# Patient Record
Sex: Female | Born: 1995 | Race: White | Hispanic: No | Marital: Single | Smoking: Never smoker
Health system: Southern US, Community
[De-identification: ages and names within clinical notes are randomized; demographics above are authoritative.]

## PROBLEM LIST (undated history)

## (undated) ENCOUNTER — Inpatient Hospital Stay (HOSPITAL_COMMUNITY): Payer: Self-pay

## (undated) DIAGNOSIS — Z9889 Other specified postprocedural states: Secondary | ICD-10-CM

## (undated) DIAGNOSIS — F32A Depression, unspecified: Secondary | ICD-10-CM

## (undated) DIAGNOSIS — F419 Anxiety disorder, unspecified: Secondary | ICD-10-CM

## (undated) DIAGNOSIS — J45909 Unspecified asthma, uncomplicated: Secondary | ICD-10-CM

## (undated) HISTORY — PX: TYMPANOSTOMY TUBE PLACEMENT: SHX32

## (undated) HISTORY — DX: Anxiety disorder, unspecified: F41.9

## (undated) HISTORY — PX: TONSILLECTOMY: SUR1361

## (undated) HISTORY — DX: Depression, unspecified: F32.A

## (undated) HISTORY — DX: Unspecified asthma, uncomplicated: J45.909

## (undated) HISTORY — PX: OTHER SURGICAL HISTORY: SHX169

---
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
## 2016-06-13 IMAGING — US US OB COMP LESS 14 WK
1 series · 14 of 28 positions shown · non-contrast
Comparison: None.

CLINICAL DATA: First trimester pregnancy. Abdominal pain. LMP
unsure. Initial encounter.

EXAM:
OBSTETRIC <14 WK US AND TRANSVAGINAL OB US
TECHNIQUE: Both transabdominal and transvaginal ultrasound examinations were
performed for complete evaluation of the gestation as well as the
maternal uterus, adnexal regions, and pelvic cul-de-sac.
Transvaginal technique was performed to assess early pregnancy.

[Series 1: us ob comp less 14 wk · 33 acquisitions, 14 frames shown]
[im 2/33]
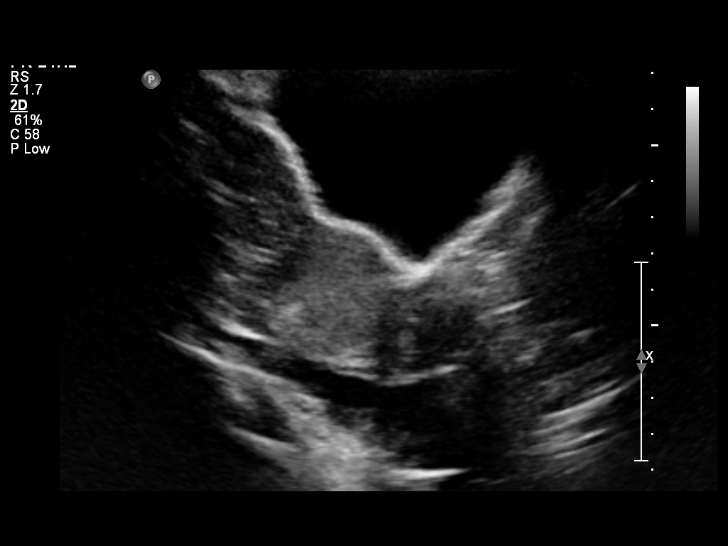
[im 4/33]
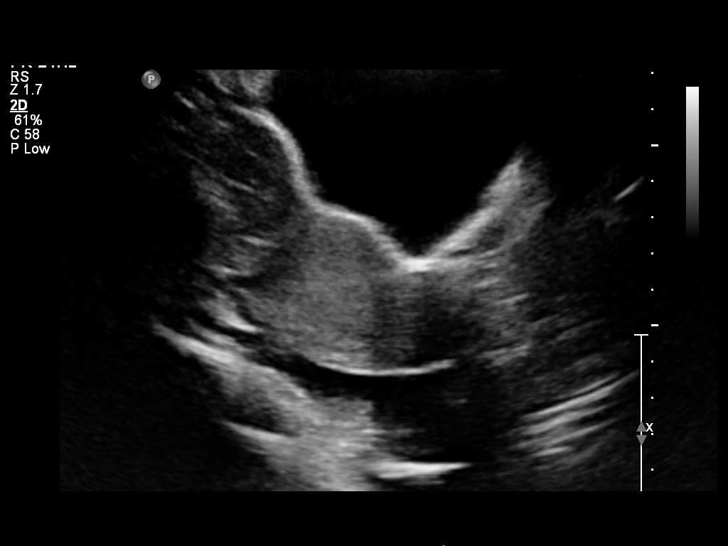
[im 6/33]
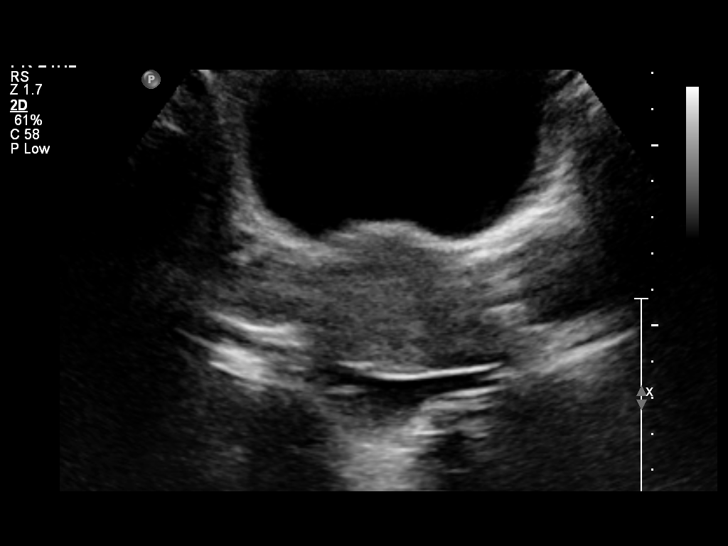
[im 9/33]
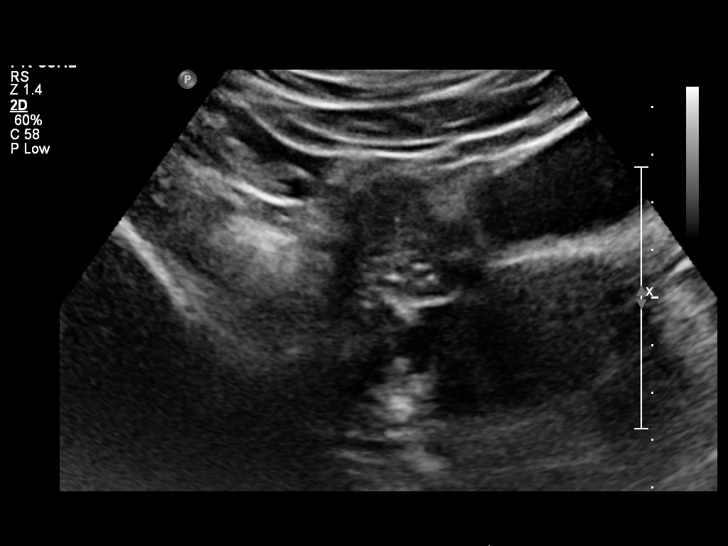
[im 11/33]
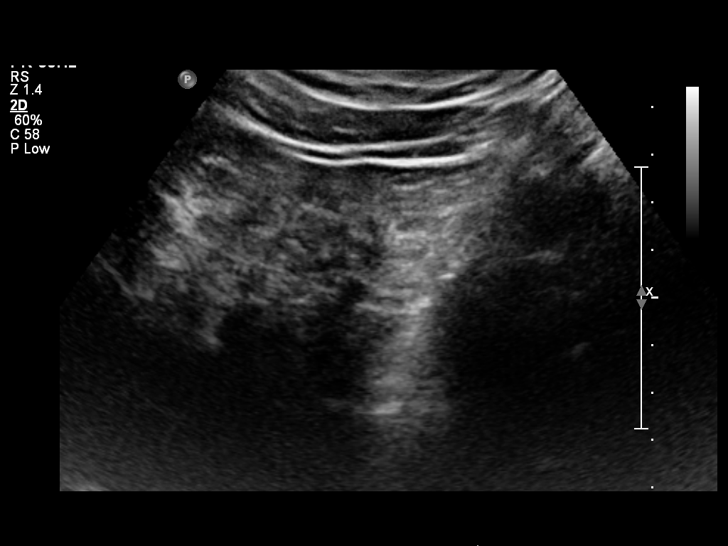
[im 14/33]
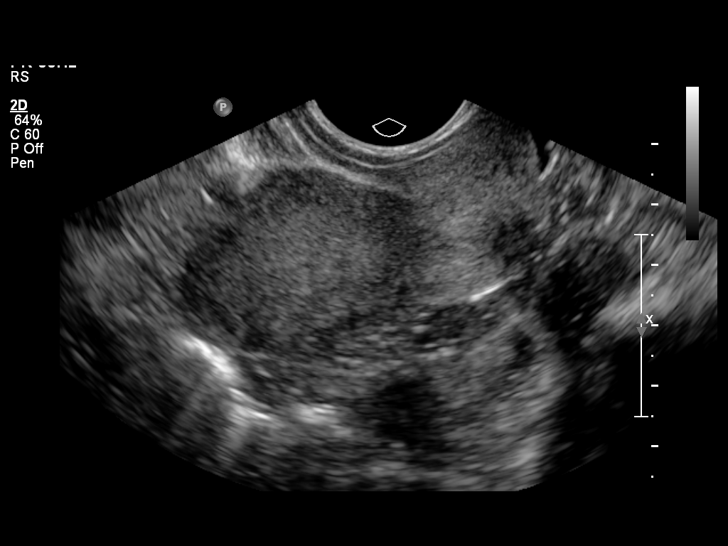
[im 16/33]
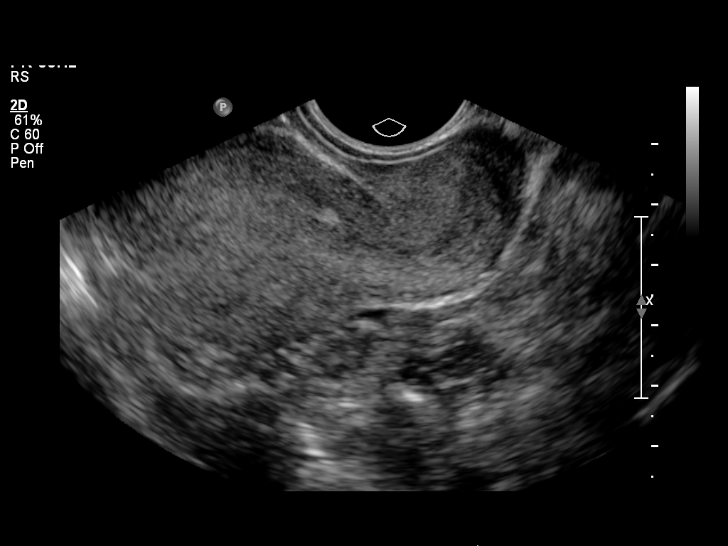
[im 18/33]
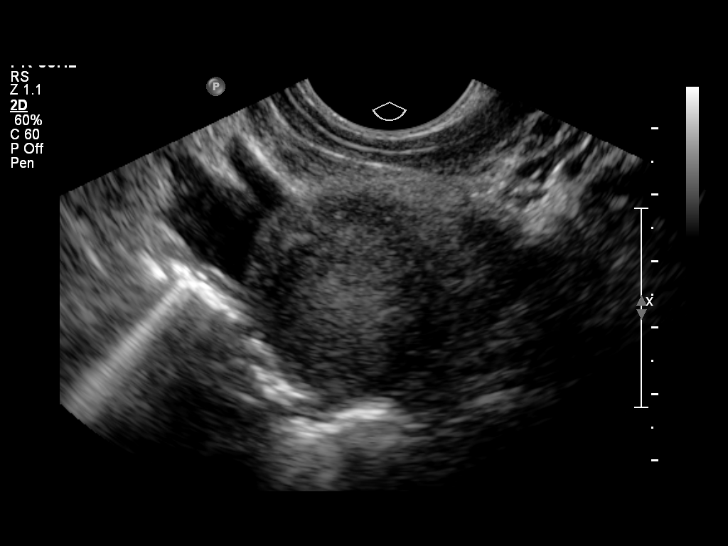
[im 21/33]
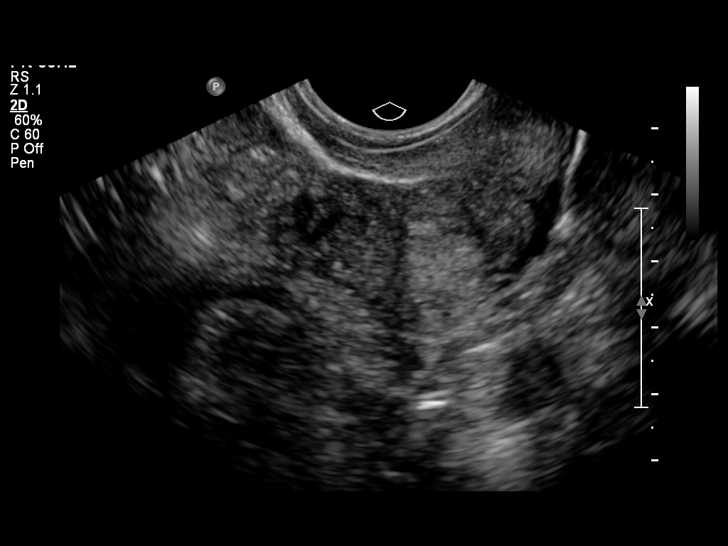
[im 23/33]
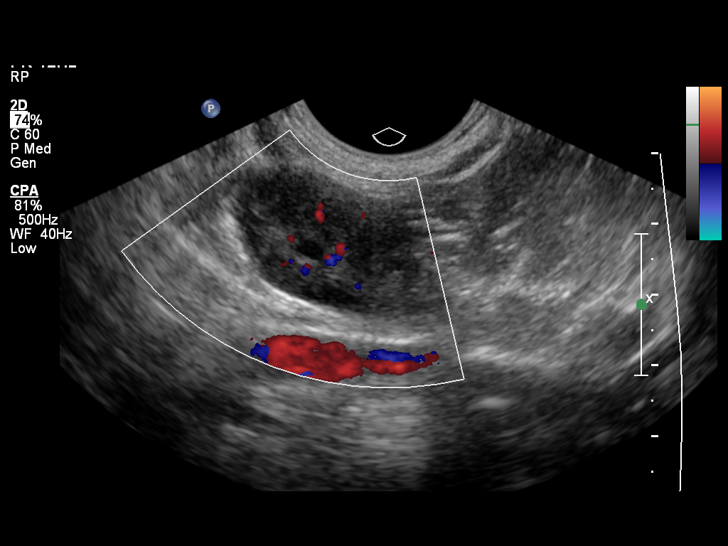
[im 25/33]
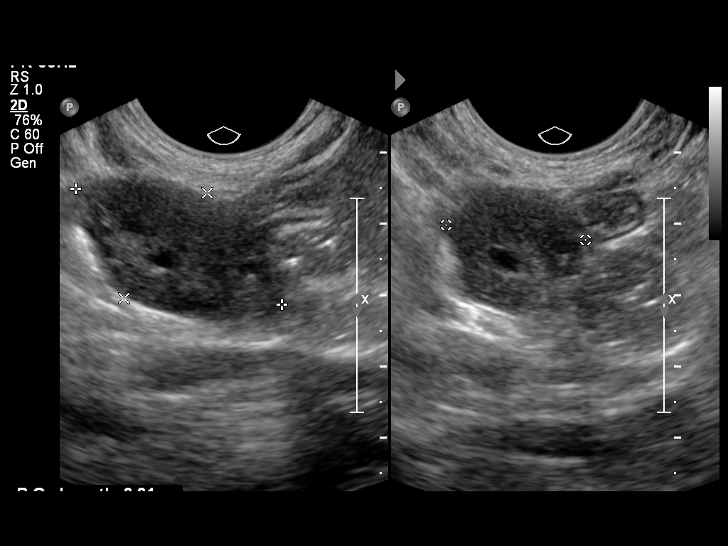
[im 28/33]
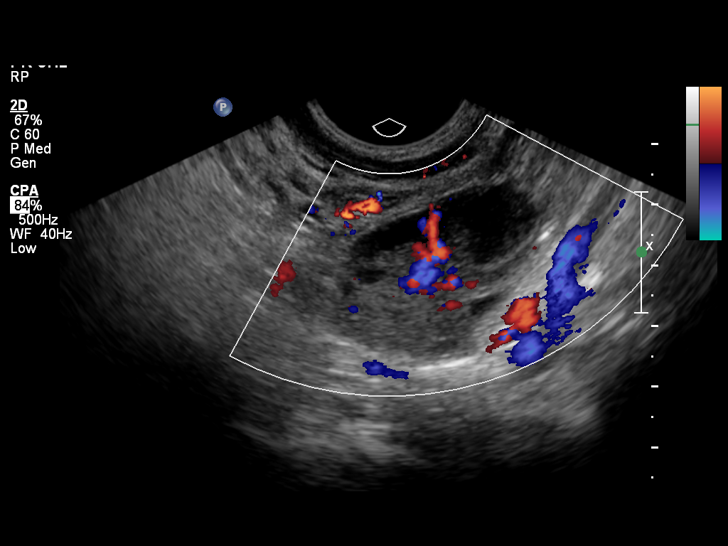
[im 30/33]
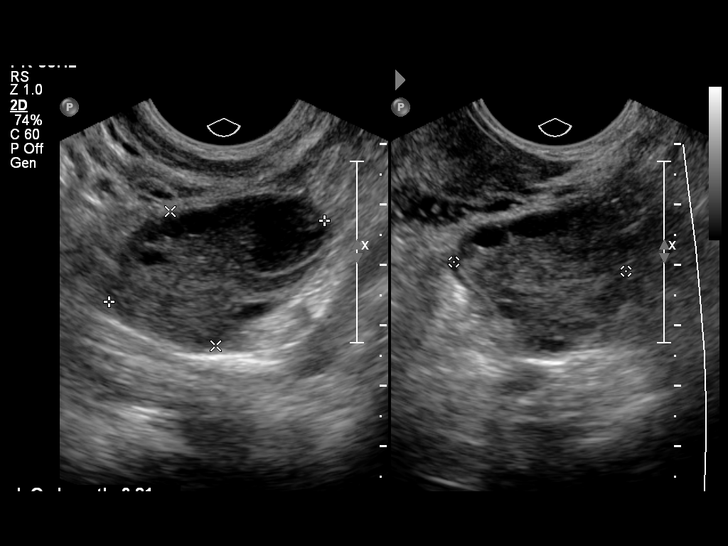
[im 33/33]
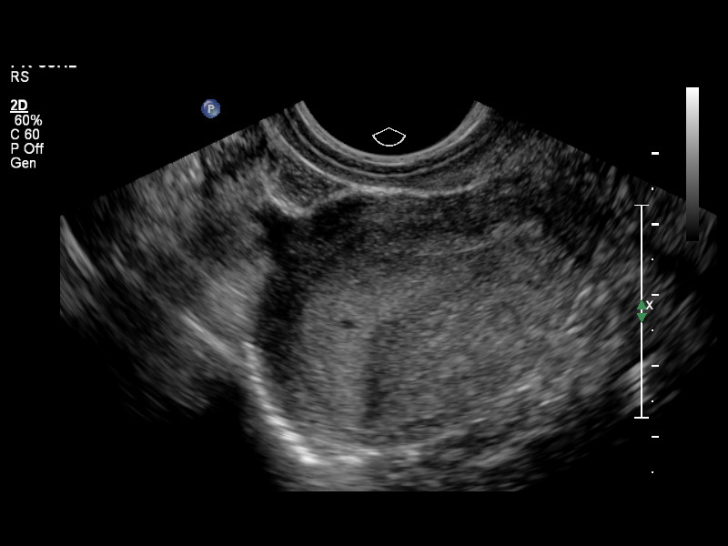

[14 of 28 positions shown; findings below may reference images not displayed]

FINDINGS: Intrauterine gestational sac: No definite intrauterine gestational
sac identified. There are tiny cystic areas within the endometrium.

Yolk sac:  None visualized.

Embryo:  None visualized.

Cardiac Activity: Not visualized.

Maternal uterus/adnexae: Both maternal ovaries are visualized and
appear normal. A small amount of free pelvic fluid is present
asymmetric to the right.
IMPRESSION: No intrauterine gestational sac, yolk sac, fetal pole, or cardiac
activity visualized. Differential considerations include
intrauterine gestation too early to be sonographically visualized,
spontaneous abortion, or ectopic pregnancy. Consider follow-up
ultrasound in 14 days and serial quantitative beta HCG follow-up.

## 2016-07-09 DIAGNOSIS — Z9621 Cochlear implant status: Secondary | ICD-10-CM | POA: Insufficient documentation

## 2016-12-06 DIAGNOSIS — Z8759 Personal history of other complications of pregnancy, childbirth and the puerperium: Secondary | ICD-10-CM | POA: Insufficient documentation

## 2017-08-18 DIAGNOSIS — J45909 Unspecified asthma, uncomplicated: Secondary | ICD-10-CM | POA: Insufficient documentation

## 2018-03-31 DIAGNOSIS — G8921 Chronic pain due to trauma: Secondary | ICD-10-CM | POA: Insufficient documentation

## 2018-09-30 DIAGNOSIS — H906 Mixed conductive and sensorineural hearing loss, bilateral: Secondary | ICD-10-CM | POA: Insufficient documentation

## 2018-12-02 DIAGNOSIS — Z23 Encounter for immunization: Secondary | ICD-10-CM | POA: Diagnosis not present

## 2018-12-02 DIAGNOSIS — G8921 Chronic pain due to trauma: Secondary | ICD-10-CM | POA: Diagnosis not present

## 2019-05-28 DIAGNOSIS — Z23 Encounter for immunization: Secondary | ICD-10-CM | POA: Diagnosis not present

## 2019-06-25 DIAGNOSIS — Z23 Encounter for immunization: Secondary | ICD-10-CM | POA: Diagnosis not present

## 2019-07-29 DIAGNOSIS — Z419 Encounter for procedure for purposes other than remedying health state, unspecified: Secondary | ICD-10-CM | POA: Diagnosis not present

## 2019-08-29 DIAGNOSIS — Z419 Encounter for procedure for purposes other than remedying health state, unspecified: Secondary | ICD-10-CM | POA: Diagnosis not present

## 2019-09-29 DIAGNOSIS — R198 Other specified symptoms and signs involving the digestive system and abdomen: Secondary | ICD-10-CM | POA: Diagnosis not present

## 2019-09-29 DIAGNOSIS — H938X2 Other specified disorders of left ear: Secondary | ICD-10-CM | POA: Diagnosis not present

## 2019-09-29 DIAGNOSIS — M2651 Abnormal jaw closure: Secondary | ICD-10-CM | POA: Diagnosis not present

## 2019-09-29 DIAGNOSIS — H6121 Impacted cerumen, right ear: Secondary | ICD-10-CM | POA: Diagnosis not present

## 2019-09-29 DIAGNOSIS — Z419 Encounter for procedure for purposes other than remedying health state, unspecified: Secondary | ICD-10-CM | POA: Diagnosis not present

## 2019-10-29 DIAGNOSIS — Z419 Encounter for procedure for purposes other than remedying health state, unspecified: Secondary | ICD-10-CM | POA: Diagnosis not present

## 2019-11-29 DIAGNOSIS — Z419 Encounter for procedure for purposes other than remedying health state, unspecified: Secondary | ICD-10-CM | POA: Diagnosis not present

## 2019-12-14 DIAGNOSIS — J4541 Moderate persistent asthma with (acute) exacerbation: Secondary | ICD-10-CM | POA: Diagnosis not present

## 2019-12-14 DIAGNOSIS — J302 Other seasonal allergic rhinitis: Secondary | ICD-10-CM | POA: Diagnosis not present

## 2019-12-14 DIAGNOSIS — Z131 Encounter for screening for diabetes mellitus: Secondary | ICD-10-CM | POA: Diagnosis not present

## 2019-12-14 DIAGNOSIS — G629 Polyneuropathy, unspecified: Secondary | ICD-10-CM | POA: Diagnosis not present

## 2019-12-14 DIAGNOSIS — Q231 Congenital insufficiency of aortic valve: Secondary | ICD-10-CM | POA: Diagnosis not present

## 2019-12-29 DIAGNOSIS — Z419 Encounter for procedure for purposes other than remedying health state, unspecified: Secondary | ICD-10-CM | POA: Diagnosis not present

## 2020-02-29 DIAGNOSIS — Z419 Encounter for procedure for purposes other than remedying health state, unspecified: Secondary | ICD-10-CM | POA: Diagnosis not present

## 2020-04-28 DIAGNOSIS — Z419 Encounter for procedure for purposes other than remedying health state, unspecified: Secondary | ICD-10-CM | POA: Diagnosis not present

## 2020-05-28 DIAGNOSIS — Z419 Encounter for procedure for purposes other than remedying health state, unspecified: Secondary | ICD-10-CM | POA: Diagnosis not present

## 2020-06-28 DIAGNOSIS — Z419 Encounter for procedure for purposes other than remedying health state, unspecified: Secondary | ICD-10-CM | POA: Diagnosis not present

## 2020-07-28 DIAGNOSIS — Z419 Encounter for procedure for purposes other than remedying health state, unspecified: Secondary | ICD-10-CM | POA: Diagnosis not present

## 2020-08-31 DIAGNOSIS — H5213 Myopia, bilateral: Secondary | ICD-10-CM | POA: Diagnosis not present

## 2020-09-18 DIAGNOSIS — Z9889 Other specified postprocedural states: Secondary | ICD-10-CM | POA: Diagnosis not present

## 2020-09-18 DIAGNOSIS — Z09 Encounter for follow-up examination after completed treatment for conditions other than malignant neoplasm: Secondary | ICD-10-CM | POA: Diagnosis not present

## 2020-09-18 DIAGNOSIS — Z8774 Personal history of (corrected) congenital malformations of heart and circulatory system: Secondary | ICD-10-CM | POA: Diagnosis not present

## 2020-09-18 DIAGNOSIS — Q230 Congenital stenosis of aortic valve: Secondary | ICD-10-CM | POA: Diagnosis not present

## 2020-09-18 DIAGNOSIS — R079 Chest pain, unspecified: Secondary | ICD-10-CM | POA: Diagnosis not present

## 2020-09-23 DIAGNOSIS — R079 Chest pain, unspecified: Secondary | ICD-10-CM | POA: Diagnosis not present

## 2021-01-11 ENCOUNTER — Ambulatory Visit: Payer: Self-pay | Admitting: Family Medicine

## 2021-02-20 DIAGNOSIS — H60502 Unspecified acute noninfective otitis externa, left ear: Secondary | ICD-10-CM | POA: Diagnosis not present

## 2021-02-20 DIAGNOSIS — Z888 Allergy status to other drugs, medicaments and biological substances status: Secondary | ICD-10-CM | POA: Diagnosis not present

## 2021-02-20 DIAGNOSIS — Z882 Allergy status to sulfonamides status: Secondary | ICD-10-CM | POA: Diagnosis not present

## 2021-02-20 DIAGNOSIS — Z79899 Other long term (current) drug therapy: Secondary | ICD-10-CM | POA: Diagnosis not present

## 2021-04-19 ENCOUNTER — Encounter (HOSPITAL_COMMUNITY): Payer: Self-pay | Admitting: Emergency Medicine

## 2021-04-19 ENCOUNTER — Emergency Department (HOSPITAL_COMMUNITY)
Admission: EM | Admit: 2021-04-19 | Discharge: 2021-04-19 | Disposition: A | Discharge status: Home / Self Care | Payer: Medicaid Other | Attending: Emergency Medicine | Admitting: Emergency Medicine

## 2021-04-19 ENCOUNTER — Emergency Department (HOSPITAL_COMMUNITY): Payer: Medicaid Other

## 2021-04-19 DIAGNOSIS — Z7982 Long term (current) use of aspirin: Secondary | ICD-10-CM | POA: Insufficient documentation

## 2021-04-19 DIAGNOSIS — Z20822 Contact with and (suspected) exposure to covid-19: Secondary | ICD-10-CM | POA: Insufficient documentation

## 2021-04-19 DIAGNOSIS — J189 Pneumonia, unspecified organism: Secondary | ICD-10-CM | POA: Insufficient documentation

## 2021-04-19 DIAGNOSIS — J45909 Unspecified asthma, uncomplicated: Secondary | ICD-10-CM | POA: Insufficient documentation

## 2021-04-19 DIAGNOSIS — R519 Headache, unspecified: Secondary | ICD-10-CM | POA: Diagnosis present

## 2021-04-19 LAB — CBC WITH DIFFERENTIAL/PLATELET
Abs Immature Granulocytes: 0.02 10*3/uL (ref 0.00–0.07)
Basophils Absolute: 0 10*3/uL (ref 0.0–0.1)
Basophils Relative: 0 %
Eosinophils Absolute: 0 10*3/uL (ref 0.0–0.5)
Eosinophils Relative: 0 %
HCT: 39.9 % (ref 36.0–46.0)
Hemoglobin: 13 g/dL (ref 12.0–15.0)
Immature Granulocytes: 0 %
Lymphocytes Relative: 13 %
Lymphs Abs: 0.9 10*3/uL (ref 0.7–4.0)
MCH: 27.4 pg (ref 26.0–34.0)
MCHC: 32.6 g/dL (ref 30.0–36.0)
MCV: 84 fL (ref 80.0–100.0)
Monocytes Absolute: 0.5 10*3/uL (ref 0.1–1.0)
Monocytes Relative: 6 %
Neutro Abs: 5.6 10*3/uL (ref 1.7–7.7)
Neutrophils Relative %: 81 %
Platelets: 187 10*3/uL (ref 150–400)
RBC: 4.75 MIL/uL (ref 3.87–5.11)
RDW: 13.8 % (ref 11.5–15.5)
WBC: 7 10*3/uL (ref 4.0–10.5)
nRBC: 0 % (ref 0.0–0.2)

## 2021-04-19 LAB — URINALYSIS, ROUTINE W REFLEX MICROSCOPIC
Bilirubin Urine: NEGATIVE
Glucose, UA: NEGATIVE mg/dL
Ketones, ur: 80 mg/dL — AB
Leukocytes,Ua: NEGATIVE
Nitrite: NEGATIVE
Protein, ur: 30 mg/dL — AB
Specific Gravity, Urine: 1.027 (ref 1.005–1.030)
pH: 5 (ref 5.0–8.0)

## 2021-04-19 LAB — PREGNANCY, URINE: Preg Test, Ur: NEGATIVE

## 2021-04-19 LAB — COMPREHENSIVE METABOLIC PANEL
ALT: 16 U/L (ref 0–44)
AST: 19 U/L (ref 15–41)
Albumin: 4.5 g/dL (ref 3.5–5.0)
Alkaline Phosphatase: 51 U/L (ref 38–126)
Anion gap: 12 (ref 5–15)
BUN: 12 mg/dL (ref 6–20)
CO2: 22 mmol/L (ref 22–32)
Calcium: 9.1 mg/dL (ref 8.9–10.3)
Chloride: 100 mmol/L (ref 98–111)
Creatinine, Ser: 0.56 mg/dL (ref 0.44–1.00)
GFR, Estimated: >60 mL/min (ref >60)
Glucose, Bld: 98 mg/dL (ref 70–99)
Potassium: 3.8 mmol/L (ref 3.5–5.1)
Sodium: 134 mmol/L — ABNORMAL LOW (ref 135–145)
Total Bilirubin: 0.3 mg/dL (ref 0.3–1.2)
Total Protein: 8.1 g/dL (ref 6.5–8.1)

## 2021-04-19 LAB — RESP PANEL BY RT-PCR (FLU A&B, COVID) ARPGX2
Influenza A by PCR: NEGATIVE
Influenza B by PCR: NEGATIVE
SARS Coronavirus 2 by RT PCR: NEGATIVE

## 2021-04-19 MED ORDER — IBUPROFEN 400 MG PO TABS
400.0000 mg | ORAL_TABLET | Freq: Once | ORAL | Status: AC
  Administered 2021-04-19: 400 mg via ORAL
  Filled 2021-04-19: qty 1

## 2021-04-19 MED ORDER — SODIUM CHLORIDE 0.9 % IV BOLUS
1000.0000 mL | Freq: Once | INTRAVENOUS | Status: AC
  Administered 2021-04-19: 1000 mL via INTRAVENOUS

## 2021-04-19 MED ORDER — ACETAMINOPHEN 325 MG PO TABS
650.0000 mg | ORAL_TABLET | Freq: Once | ORAL | Status: AC
  Administered 2021-04-19: 650 mg via ORAL
  Filled 2021-04-19: qty 2

## 2021-04-19 MED ORDER — PROMETHAZINE HCL 12.5 MG PO TABS
25.0000 mg | ORAL_TABLET | Freq: Once | ORAL | Status: AC
  Administered 2021-04-19: 25 mg via ORAL
  Filled 2021-04-19: qty 2

## 2021-04-19 MED ORDER — AZITHROMYCIN 250 MG PO TABS: 250.0000 mg | ORAL_TABLET | Freq: Every day | ORAL | 0 refills | Status: DC

## 2021-04-19 MED ORDER — CEFDINIR 300 MG PO CAPS: 300.0000 mg | ORAL_CAPSULE | Freq: Two times a day (BID) | ORAL | 0 refills | Status: AC

## 2021-04-19 MED ORDER — AZITHROMYCIN 250 MG PO TABS
500.0000 mg | ORAL_TABLET | Freq: Once | ORAL | Status: AC
  Administered 2021-04-19: 500 mg via ORAL
  Filled 2021-04-19: qty 2

## 2021-04-19 MED ORDER — CEFDINIR 300 MG PO CAPS
300.0000 mg | ORAL_CAPSULE | Freq: Once | ORAL | Status: AC
  Administered 2021-04-19: 300 mg via ORAL
  Filled 2021-04-19: qty 1

## 2021-04-19 NOTE — Discharge Instructions (Signed)
Your chest x-ray shows pneumonia.  This appears to be causing your fever and headache.  You are being prescribed antibiotics for this, start these antibiotics tomorrow as you were given the first dose of both tonight.  Be sure to take ibuprofen and/or Tylenol for pain and/or fever. ? ?If you develop shortness of breath, chest pain, vomiting, or any other new/concerning symptoms then return to the ER for evaluation. ?

## 2021-04-19 NOTE — ED Notes (Signed)
Pt taken to xray 

## 2021-04-19 NOTE — ED Triage Notes (Signed)
Pt c/o headache on and off for the past 2 days. Pt states she tried tylenol with no relief.  ?

## 2021-04-19 NOTE — ED Provider Notes (Signed)
?[facility] ?Provider Note ? ? ?CSN: [id number] ?Arrival date & time: 04/19/21  1927 ? ?  ? ?History ? ?Chief Complaint  ?Patient presents with  ? Headache  ? ? ?Jo Martin is a 26 y.o. female. ? ?HPI ?26 year old female with a history of asthma presents with headache.  Symptoms started yesterday.  She has had a cough as well as a waxing and waning headache that is mostly frontal but sometimes global.  At times it will be severe and other times will be more moderate.  She has felt warm but did not know she had a fever until she got here and her temperature was 103.  She denies any sore throat, nasal congestion, or shortness of breath.  No neck stiffness.  Feels like she is dehydrated and has had nausea without vomiting and is not eating as well or drinking as well today. ? ?Home Medications ?Prior to Admission medications   ?Medication Sig Start Date End Date Taking? Authorizing Provider  ?azithromycin (ZITHROMAX) 250 MG tablet Take 1 tablet (250 mg total) by mouth daily. 04/19/21  Yes Pricilla Loveless, MD  ?cefdinir (OMNICEF) 300 MG capsule Take 1 capsule (300 mg total) by mouth 2 (two) times daily for 7 days. 04/19/21 04/26/21 Yes Pricilla Loveless, MD  ?acetaminophen (TYLENOL) 500 MG tablet Take 1,000 mg by mouth every 6 (six) hours as needed for moderate pain.    [provider]  ?albuterol (PROVENTIL HFA;VENTOLIN HFA) 108 (90 BASE) MCG/ACT inhaler Inhale 2 puffs into the lungs every 6 (six) hours as needed for wheezing or shortness of breath.    [provider]  ?aspirin 325 MG tablet Take 325 mg by mouth 2 (two) times daily.    [provider]  ?LORazepam (ATIVAN) 0.5 MG tablet Take 0.5 mg by mouth 2 (two) times daily.    [provider]  ?oxyCODONE (ROXICODONE) 5 MG immediate release tablet Take 1 tablet (5 mg total) by mouth every 4 (four) hours as needed for severe pain. 11/21/14   Gavin Pound, MD  ?oxyCODONE-acetaminophen (PERCOCET/ROXICET) 5-325 MG  tablet Take 1 tablet by mouth every 6 (six) hours as needed for severe pain.    [provider]  ?   ? ?Allergies    ?Augmentin [amoxicillin-pot clavulanate]   ? ?Review of Systems   ?Review of Systems  ?HENT:  Negative for ear pain.   ?Respiratory:  Positive for cough. Negative for shortness of breath.   ?Cardiovascular:  Negative for chest pain.  ?Gastrointestinal:  Positive for nausea. Negative for abdominal pain and vomiting.  ?Genitourinary:  Negative for dysuria.  ?Musculoskeletal:  Negative for neck pain.  ?Neurological:  Positive for headaches.  ? ?Physical Exam ?Updated Vital Signs ?BP 114/77   Pulse (!) 105   Temp (!) 103.1 ?F (39.5 ?C) (Oral)   Resp 17   Ht 4\' 8"  (1.422 m)   Wt 76.2 kg   SpO2 98%   BMI 37.66 kg/m?  ?Physical Exam ?Vitals and nursing note reviewed.  ?Constitutional:   ?   Appearance: She is well-developed. She is obese. She is not ill-appearing or diaphoretic.  ?HENT:  ?   Head: Normocephalic and atraumatic.  ?Eyes:  ?   Extraocular Movements: Extraocular movements intact.  ?   Pupils: Pupils are equal, round, and reactive to light.  ?Cardiovascular:  ?   Rate and Rhythm: Regular rhythm. Tachycardia present.  ?   Heart sounds: Normal heart sounds.  ?Pulmonary:  ?   Effort: Pulmonary  effort is normal.  ?   Breath sounds: Normal breath sounds. No rales.  ?Abdominal:  ?   Palpations: Abdomen is soft.  ?   Tenderness: There is no abdominal tenderness.  ?Musculoskeletal:  ?   Cervical back: Normal range of motion and neck supple. No rigidity.  ?Skin: ?   General: Skin is warm and dry.  ?Neurological:  ?   Mental Status: She is alert and oriented to person, place, and time.  ?   Comments: CN 3-12 grossly intact. 5/5 strength in all 4 extremities. Grossly normal sensation. Normal finger to nose.   ? ? ?ED Results / Procedures / Treatments   ?Labs ?(all labs ordered are listed, but only abnormal results are displayed) ?Labs Reviewed  ?URINALYSIS, ROUTINE W REFLEX MICROSCOPIC -  Abnormal; Notable for the following components:  ?    Result Value  ? Hgb urine dipstick MODERATE (*)   ? Ketones, ur 80 (*)   ? Protein, ur 30 (*)   ? Bacteria, UA RARE (*)   ? All other components within normal limits  ?COMPREHENSIVE METABOLIC PANEL - Abnormal; Notable for the following components:  ? Sodium 134 (*)   ? All other components within normal limits  ?RESP PANEL BY RT-PCR (FLU A&B, COVID) ARPGX2  ?CBC WITH DIFFERENTIAL/PLATELET  ?PREGNANCY, URINE  ? ? ?EKG ?None ? ?Radiology ?DG Chest 2 View ? ?Result Date: 04/19/2021 ?CLINICAL DATA:  Cough and fever EXAM: CHEST - 2 VIEW COMPARISON:  07/04/2017 FINDINGS: Airspace disease in the right middle lobe consistent with pneumonia. No pleural effusion. Normal cardiac size. Post sternotomy changes. Scoliosis of the spine. IMPRESSION: Right middle lobe airspace disease concerning for pneumonia Electronically Signed   By: Jasmine PangKim  Fujinaga M.D.   On: 04/19/2021 22:10   ? ?Procedures ?Procedures  ? ? ?Medications Ordered in ED ?Medications  ?acetaminophen (TYLENOL) tablet 650 mg (650 mg Oral Given 04/19/21 2117)  ?ibuprofen (ADVIL) tablet 400 mg (400 mg Oral Given 04/19/21 2119)  ?sodium chloride 0.9 % bolus 1,000 mL (0 mLs Intravenous Stopped 04/19/21 2250)  ?promethazine (PHENERGAN) tablet 25 mg (25 mg Oral Given 04/19/21 2157)  ?azithromycin (ZITHROMAX) tablet 500 mg (500 mg Oral Given 04/19/21 2238)  ?cefdinir (OMNICEF) capsule 300 mg (300 mg Oral Given 04/19/21 2239)  ? ? ?ED Course/ Medical Decision Making/ A&P ?  ?                        ?Medical Decision Making ?Amount and/or Complexity of Data Reviewed ?Labs: ordered. ?Radiology: ordered. ? ?Risk ?OTC drugs. ?Prescription drug management. ? ? ?Patient was given ibuprofen and Tylenol prior to me seeing her and this has improved her heart rate.  I have a pretty low suspicion of of sepsis with a normal blood pressure, normal WBC, and well appearance.  Headache seems to be coming from the fever which seems to be coming  from the pneumonia.  I personally reviewed/interpreted the chest x-ray images which shows right-sided pneumonia.  Labs reviewed/interpreted and her WBC is normal, creatinine is normal, pregnancy test is normal.  She will be started on oral antibiotics.  She has been prescribed cephalexin before and so I think she can handle cefdinir despite her Augmentin allergy.  Given her history of asthma we will also include azithromycin.  These have been sent to her pharmacy.  She otherwise appears stable for discharge home with return precautions.  COVID testing is negative. ? ? ? ? ? ? ? ?Final  Clinical Impression(s) / ED Diagnoses ?Final diagnoses:  ?Community acquired pneumonia of right middle lobe of lung  ? ? ?Rx / DC Orders ?ED Discharge Orders   ? ?      Ordered  ?  cefdinir (OMNICEF) 300 MG capsule  2 times daily       ? 04/19/21 2306  ?  azithromycin (ZITHROMAX) 250 MG tablet  Daily       ? 04/19/21 2306  ? ?  ?  ? ?  ? ? ?  ?Pricilla Loveless, MD ?04/19/21 2314 ? ?

## 2021-09-03 LAB — HM PAP SMEAR

## 2021-10-04 DIAGNOSIS — F411 Generalized anxiety disorder: Secondary | ICD-10-CM | POA: Insufficient documentation

## 2021-10-04 DIAGNOSIS — F4310 Post-traumatic stress disorder, unspecified: Secondary | ICD-10-CM | POA: Insufficient documentation

## 2022-06-10 ENCOUNTER — Telehealth: Payer: Self-pay

## 2022-06-10 NOTE — Telephone Encounter (Signed)
Sending mychart msg. AS, CMA 

## 2022-09-28 ENCOUNTER — Encounter (HOSPITAL_COMMUNITY): Payer: Self-pay

## 2022-09-28 ENCOUNTER — Emergency Department (HOSPITAL_COMMUNITY)
Admission: EM | Admit: 2022-09-28 | Discharge: 2022-09-28 | Disposition: A | Discharge status: Home / Self Care | Payer: Medicaid Other | Attending: Emergency Medicine | Admitting: Emergency Medicine

## 2022-09-28 ENCOUNTER — Other Ambulatory Visit: Payer: Self-pay

## 2022-09-28 DIAGNOSIS — N926 Irregular menstruation, unspecified: Secondary | ICD-10-CM

## 2022-09-28 DIAGNOSIS — Z7982 Long term (current) use of aspirin: Secondary | ICD-10-CM | POA: Diagnosis not present

## 2022-09-28 LAB — POC URINE PREG, ED
Preg Test, Ur: NEGATIVE
Preg Test, Ur: NEGATIVE

## 2022-09-28 LAB — HCG, QUANTITATIVE, PREGNANCY: hCG, Beta Chain, Quant, S: <1 m[IU]/mL (ref <5)

## 2022-09-28 NOTE — ED Triage Notes (Signed)
Pt stated she is 6 weeks late for her period Has been trying to get pregnant Requests a pregnancy test 2 negative [facility] tests.   No bleeding No cramping No pain

## 2022-09-28 NOTE — Discharge Instructions (Signed)
As discussed, your urine pregnancy test was negative today.  We will perform a blood pregnancy test.  Follow results on MyChart app on your phone.  If you test positive, recommend follow-up with OB/GYN.  See information attached to discharge papers to call to schedule an appointment.  Please do not hesitate to return to emergency department if the worrisome signs and symptoms we discussed become apparent.

## 2022-09-28 NOTE — ED Provider Notes (Signed)
[facility]PART[facility] AT [facility] Provider Note   CSN: [id number] Arrival date & time: 09/28/22  1207     History  Chief Complaint  Patient presents with   Possible Pregnancy    Jo Martin is a 27 y.o. female.   Possible Pregnancy   27 year old female presents emergency department with concerns of possible pregnancy.  Patient states that her last menstrual cycle was approximately 6 weeks ago.  States that she is usually regular having her menstrual cycle about the same time every month.  Patient is actively trying to get pregnant.  Reports having 2 negative urine pregnancy test that she got at the local pharmacy and is requesting pregnancy testing while in the ED.  Patient has no other complaint.  Denies any fever, chills, night sweats, chest pain, shortness of breath, abdominal pain, nausea, vomiting, urinary/vaginal symptoms, change in bowel habits.  No significant pertinent past medical history.  Home Medications Prior to Admission medications   Medication Sig Start Date End Date Taking? Authorizing Provider  acetaminophen (TYLENOL) 500 MG tablet Take 1,000 mg by mouth every 6 (six) hours as needed for moderate pain.    [provider]  albuterol (PROVENTIL HFA;VENTOLIN HFA) 108 (90 BASE) MCG/ACT inhaler Inhale 2 puffs into the lungs every 6 (six) hours as needed for wheezing or shortness of breath.    [provider]  aspirin 325 MG tablet Take 325 mg by mouth 2 (two) times daily.    [provider]  azithromycin (ZITHROMAX) 250 MG tablet Take 1 tablet (250 mg total) by mouth daily. 04/19/21   Pricilla Loveless, MD  LORazepam (ATIVAN) 0.5 MG tablet Take 0.5 mg by mouth 2 (two) times daily.    [provider]  oxyCODONE (ROXICODONE) 5 MG immediate release tablet Take 1 tablet (5 mg total) by mouth every 4 (four) hours as needed for severe pain. 11/21/14   Gavin Pound, MD  oxyCODONE-acetaminophen (PERCOCET/ROXICET) 5-325  MG tablet Take 1 tablet by mouth every 6 (six) hours as needed for severe pain.    [provider]      Allergies    Penicillins, Sulfa antibiotics, and Augmentin [amoxicillin-pot clavulanate]    Review of Systems   Review of Systems  All other systems reviewed and are negative.   Physical Exam Updated Vital Signs BP (!) 127/92 (BP Location: Right Arm)   Pulse 87   Temp 98.5 F (36.9 C) (Oral)   Resp 18   Ht 4\' 7"  (1.397 m)   Wt 77.1 kg   SpO2 95%   BMI 39.51 kg/m  Physical Exam Vitals and nursing note reviewed.  Constitutional:      General: She is not in acute distress.    Appearance: She is well-developed.  HENT:     Head: Normocephalic and atraumatic.  Eyes:     Conjunctiva/sclera: Conjunctivae normal.  Cardiovascular:     Rate and Rhythm: Normal rate and regular rhythm.     Heart sounds: No murmur heard. Pulmonary:     Effort: Pulmonary effort is normal. No respiratory distress.     Breath sounds: Normal breath sounds.  Abdominal:     Palpations: Abdomen is soft.     Tenderness: There is no abdominal tenderness.  Musculoskeletal:        General: No swelling.     Cervical back: Neck supple.  Skin:    General: Skin is warm and dry.     Capillary Refill: Capillary refill takes less than 2  seconds.  Neurological:     Mental Status: She is alert.  Psychiatric:        Mood and Affect: Mood normal.     ED Results / Procedures / Treatments   Labs (all labs ordered are listed, but only abnormal results are displayed) Labs Reviewed  HCG, QUANTITATIVE, PREGNANCY  POC URINE PREG, ED  POC URINE PREG, ED    EKG None  Radiology No results found.  Procedures Procedures    Medications Ordered in ED Medications - No data to display  ED Course/ Medical Decision Making/ A&P                                 Medical Decision Making Amount and/or Complexity of Data Reviewed Labs: ordered.   This patient presents to the ED for concern of  abnormal menstrual cycle, this involves an extensive number of treatment options, and is a complaint that carries with it a high risk of complications and morbidity.  The differential diagnosis includes pregnancy, ovarian dysfunction, other   Co morbidities that complicate the patient evaluation  See HPI   Additional history obtained:  Additional history obtained from EMR External records from outside source obtained and reviewed including hospital records   Lab Tests:  I Ordered, and personally interpreted labs.  The pertinent results include: Urine pregnancy negative.  hCG serum pending.   Imaging Studies ordered:  N/a   Cardiac Monitoring: / EKG:  The patient was maintained on a cardiac monitor.  I personally viewed and interpreted the cardiac monitored which showed an underlying rhythm of: Sinus rhythm   Consultations Obtained:  N/a   Problem List / ED Course / Critical interventions / Medication management  Abnormal menstrual cycles Reevaluation of the patient showed that the patient stayed the same I have reviewed the patients home medicines and have made adjustments as needed   Social Determinants of Health:  Denies tobacco, illicit drug use   Test / Admission - Considered:  Abnormal menstrual cycle Vitals signs within normal range and stable throughout visit. Laboratory studies significant for: See above 27 year old female presents emergency department with complaints of abnormal menstrual cycle.  States that she usually gets her menstrual cycle about the same time every month but has not been 6 weeks without 1.  Actively trying to get pregnant.  Urine pregnancy negative.  Patient requested serum hCG.  Will advised patient to follow results on MyChart and begin prenatal care if testing positive and subsequently following up with OB/GYN.  Treatment plan discussed at length with patient and she acknowledged understanding was agreeable to said plan.  Patient  overall well-appearing, afebrile in no acute distress. Worrisome signs and symptoms were discussed with the patient, and the patient acknowledged understanding to return to the ED if noticed. Patient was stable upon discharge.          Final Clinical Impression(s) / ED Diagnoses Final diagnoses:  Abnormal menstrual cycle    Rx / DC Orders ED Discharge Orders     None         Peter Garter, [place] 09/28/22 1241    Bethann Berkshire, MD 09/28/22 1646

## 2022-11-22 ENCOUNTER — Other Ambulatory Visit: Payer: Self-pay | Admitting: Family Medicine

## 2022-11-29 ENCOUNTER — Emergency Department (HOSPITAL_COMMUNITY): Payer: Medicaid Other

## 2022-11-29 ENCOUNTER — Emergency Department (HOSPITAL_COMMUNITY)
Admission: EM | Admit: 2022-11-29 | Discharge: 2022-11-29 | Disposition: A | Discharge status: Home / Self Care | Payer: Medicaid Other | Attending: Emergency Medicine | Admitting: Emergency Medicine

## 2022-11-29 ENCOUNTER — Encounter (HOSPITAL_COMMUNITY): Payer: Self-pay

## 2022-11-29 ENCOUNTER — Other Ambulatory Visit: Payer: Self-pay

## 2022-11-29 DIAGNOSIS — R059 Cough, unspecified: Secondary | ICD-10-CM | POA: Diagnosis present

## 2022-11-29 DIAGNOSIS — J45909 Unspecified asthma, uncomplicated: Secondary | ICD-10-CM | POA: Insufficient documentation

## 2022-11-29 DIAGNOSIS — D72829 Elevated white blood cell count, unspecified: Secondary | ICD-10-CM | POA: Diagnosis not present

## 2022-11-29 DIAGNOSIS — J189 Pneumonia, unspecified organism: Secondary | ICD-10-CM | POA: Diagnosis not present

## 2022-11-29 DIAGNOSIS — Z7951 Long term (current) use of inhaled steroids: Secondary | ICD-10-CM | POA: Diagnosis not present

## 2022-11-29 DIAGNOSIS — Z7982 Long term (current) use of aspirin: Secondary | ICD-10-CM | POA: Insufficient documentation

## 2022-11-29 LAB — CBC
HCT: 43.2 % (ref 36.0–46.0)
Hemoglobin: 14.1 g/dL (ref 12.0–15.0)
MCH: 27.3 pg (ref 26.0–34.0)
MCHC: 32.6 g/dL (ref 30.0–36.0)
MCV: 83.7 fL (ref 80.0–100.0)
Platelets: 260 10*3/uL (ref 150–400)
RBC: 5.16 MIL/uL — ABNORMAL HIGH (ref 3.87–5.11)
RDW: 13.4 % (ref 11.5–15.5)
WBC: 12 10*3/uL — ABNORMAL HIGH (ref 4.0–10.5)
nRBC: 0 % (ref 0.0–0.2)

## 2022-11-29 LAB — BASIC METABOLIC PANEL
Anion gap: 10 (ref 5–15)
BUN: 12 mg/dL (ref 6–20)
CO2: 26 mmol/L (ref 22–32)
Calcium: 9 mg/dL (ref 8.9–10.3)
Chloride: 101 mmol/L (ref 98–111)
Creatinine, Ser: 0.62 mg/dL (ref 0.44–1.00)
GFR, Estimated: >60 mL/min (ref >60)
Glucose, Bld: 140 mg/dL — ABNORMAL HIGH (ref 70–99)
Potassium: 3.8 mmol/L (ref 3.5–5.1)
Sodium: 137 mmol/L (ref 135–145)

## 2022-11-29 LAB — HCG, QUANTITATIVE, PREGNANCY: hCG, Beta Chain, Quant, S: <1 m[IU]/mL (ref <5)

## 2022-11-29 LAB — TROPONIN I (HIGH SENSITIVITY): Troponin I (High Sensitivity): 3 ng/L (ref <18)

## 2022-11-29 MED ORDER — DOXYCYCLINE HYCLATE 100 MG PO CAPS: 100.0000 mg | ORAL_CAPSULE | Freq: Two times a day (BID) | ORAL | 0 refills | Status: AC

## 2022-11-29 NOTE — ED Provider Notes (Signed)
[place] EMERGENCY DEPARTMENT AT [facility] Provider Note  CSN: [id number] Arrival date & time: 11/29/22 7829  Chief Complaint(s) Cough  HPI Jo Martin is a 27 y.o. female without significant past medical history presenting to the emergency department with cough.  Patient reports cough, fatigue, chills for the past 3 to 4 days.  Maybe also had some congestion.  No sick contacts.  Reports chills, no fever.  She has a history of asthma and uses her albuterol inhaler but denies any increased shortness of breath.  Reports productive cough.  Reports a little bit of chest pain with cough.  No chest pain with breathing or at rest.  No abdominal pain.  No nausea or vomiting.  She reports it feels similar to when she had a pneumonia previously.   Past Medical History Past Medical History:  Diagnosis Date   History of open heart surgery    There are no problems to display for this patient.  Home Medication(s) Prior to Admission medications   Medication Sig Start Date End Date Taking? Authorizing Provider  doxycycline (VIBRAMYCIN) 100 MG capsule Take 1 capsule (100 mg total) by mouth 2 (two) times daily for 7 days. 11/29/22 12/06/22 Yes Lonell Grandchild, MD  acetaminophen (TYLENOL) 500 MG tablet Take 1,000 mg by mouth every 6 (six) hours as needed for moderate pain.    [provider]  albuterol (PROVENTIL HFA;VENTOLIN HFA) 108 (90 BASE) MCG/ACT inhaler Inhale 2 puffs into the lungs every 6 (six) hours as needed for wheezing or shortness of breath.    [provider]  aspirin 325 MG tablet Take 325 mg by mouth 2 (two) times daily.    [provider]  LORazepam (ATIVAN) 0.5 MG tablet Take 0.5 mg by mouth 2 (two) times daily.    [provider]  oxyCODONE (ROXICODONE) 5 MG immediate release tablet Take 1 tablet (5 mg total) by mouth every 4 (four) hours as needed for severe pain. 11/21/14   Gavin Pound, MD  oxyCODONE-acetaminophen  (PERCOCET/ROXICET) 5-325 MG tablet Take 1 tablet by mouth every 6 (six) hours as needed for severe pain.    [provider]                                                                                                                                    Past Surgical History Past Surgical History:  Procedure Laterality Date   cochlear implants x2     open heart surgery     Family History History reviewed. No pertinent family history.  Social History Social History   Tobacco Use   Smoking status: Never  Substance Use Topics   Alcohol use: No   Drug use: No   Allergies Penicillins, Sulfa antibiotics, and Augmentin [amoxicillin-pot clavulanate]  Review of Systems Review of Systems  All other systems reviewed and are negative.   Physical Exam Vital Signs  I have reviewed the  triage vital signs BP 114/79   Pulse 94   Temp 98.2 F (36.8 C) (Oral)   Resp 20   Ht 4\' 7"  (1.397 m)   Wt 77.1 kg   LMP 10/02/2022   SpO2 97%   BMI 39.51 kg/m  Physical Exam Vitals and nursing note reviewed.  Constitutional:      General: She is not in acute distress.    Appearance: She is well-developed.  HENT:     Head: Normocephalic and atraumatic.     Mouth/Throat:     Mouth: Mucous membranes are moist.  Eyes:     Pupils: Pupils are equal, round, and reactive to light.  Cardiovascular:     Rate and Rhythm: Normal rate and regular rhythm.     Heart sounds: No murmur heard. Pulmonary:     Effort: Pulmonary effort is normal. No respiratory distress.     Breath sounds: Examination of the left-lower field reveals rales. Rales (trace) present.  Abdominal:     General: Abdomen is flat.     Palpations: Abdomen is soft.     Tenderness: There is no abdominal tenderness.  Musculoskeletal:        General: No tenderness.     Right lower leg: No edema.     Left lower leg: No edema.  Skin:    General: Skin is warm and dry.  Neurological:     General: No focal deficit present.      Mental Status: She is alert. Mental status is at baseline.  Psychiatric:        Mood and Affect: Mood normal.        Behavior: Behavior normal.     ED Results and Treatments Labs (all labs ordered are listed, but only abnormal results are displayed) Labs Reviewed  BASIC METABOLIC PANEL - Abnormal; Notable for the following components:      Result Value   Glucose, Bld 140 (*)    All other components within normal limits  CBC - Abnormal; Notable for the following components:   WBC 12.0 (*)    RBC 5.16 (*)    All other components within normal limits  HCG, QUANTITATIVE, PREGNANCY  TROPONIN I (HIGH SENSITIVITY)                                                                                                                          Radiology DG Chest 2 View  Result Date: 11/29/2022 CLINICAL DATA:  Chest Pain EXAM: CHEST - 2 VIEW COMPARISON:  CXR 04/20/22 FINDINGS: Low lung volumes. No pleural effusion. No pneumothorax. Likely unchanged cardiac contours when accounting for differences in lung volumes. There are hazy bibasilar airspace opacities could represent atelectasis or infection. Unchanged reverse S shaped curvature of the thoracolumbar spine. Vertebral body heights are maintained. IMPRESSION: Low lung volumes with hazy bibasilar airspace opacities, which could represent atelectasis or infection. Electronically Signed   By: Lorenza Cambridge M.D.   On: 11/29/2022 08:30    Pertinent  labs & imaging results that were available during my care of the patient were reviewed by me and considered in my medical decision making (see MDM for details).  Medications Ordered in ED Medications - No data to display                                                                                                                                   Procedures Procedures  (including critical care time)  Medical Decision Making / ED Course   MDM:  27 year old female presenting to the emergency department  with cough.  Patient overall well-appearing, physical exam with some possible trace crackles in the left lower lobe.  Symptoms could represent underlying pneumonia, chest x-ray does show some bibasilar infiltrate which could be a pneumonia.  Will treat with doxycycline as she is allergic to amoxicillin.  She is overall very well-appearing, not hypoxic.  She does have asthma but no wheezing to suggest acute asthma exacerbation.  Symptoms could also potentially be URI but given physical exam findings and chest x-ray will treat for pneumonia .  She did report some chest pain with cough, this seems very musculoskeletal, troponin was ordered by nursing and is negative.  EKG is also reassuring. Will discharge patient to home. All questions answered. Patient comfortable with plan of discharge. Return precautions discussed with patient and specified on the after visit summary.       Additional history obtained: -External records from outside source obtained and reviewed including: Chart review including previous notes, labs, imaging, consultation notes including prior ER visit for pneumonia    Lab Tests: -I ordered, reviewed, and interpreted labs.   The pertinent results include:   Labs Reviewed  BASIC METABOLIC PANEL - Abnormal; Notable for the following components:      Result Value   Glucose, Bld 140 (*)    All other components within normal limits  CBC - Abnormal; Notable for the following components:   WBC 12.0 (*)    RBC 5.16 (*)    All other components within normal limits  HCG, QUANTITATIVE, PREGNANCY  TROPONIN I (HIGH SENSITIVITY)    Notable for leukocytosis, normal troponin  EKG   EKG Interpretation Date/Time:  Friday November 29 2022 08:04:27 EDT Ventricular Rate:  93 PR Interval:  147 QRS Duration:  81 QT Interval:  367 QTC Calculation: 457 R Axis:   54  Text Interpretation: Sinus rhythm Probable left atrial enlargement RSR' in V1 or V2, probably normal variant Confirmed by  Alvino Blood ([id number]) on 11/29/2022 9:48:34 AM         Imaging Studies ordered: I ordered imaging studies including CXR On my interpretation imaging demonstrates pneumonia  I independently visualized and interpreted imaging. I agree with the radiologist interpretation   Medicines ordered and prescription drug management: Meds ordered this encounter  Medications   doxycycline (VIBRAMYCIN) 100 MG capsule    Sig: Take 1 capsule (100 mg total)  by mouth 2 (two) times daily for 7 days.    Dispense:  14 capsule    Refill:  0    -I have reviewed the patients home medicines and have made adjustments as needed   Social Determinants of Health:  Diagnosis or treatment significantly limited by social determinants of health: obesity   Co morbidities that complicate the patient evaluation  Past Medical History:  Diagnosis Date   History of open heart surgery       Dispostion: Disposition decision including need for hospitalization was considered, and patient discharged from emergency department.    Final Clinical Impression(s) / ED Diagnoses Final diagnoses:  Community acquired pneumonia, unspecified laterality     This chart was dictated using voice recognition software.  Despite best efforts to proofread,  errors can occur which can change the documentation meaning.    Lonell Grandchild, MD 11/29/22 [phone]

## 2022-11-29 NOTE — ED Triage Notes (Signed)
Pt comes in w/ complains of feeling weak/ sick for 3-4 days now. Pt has attempted cold medicine at home, at home albuterol inhaler, and states she is now having chills.

## 2022-11-29 NOTE — Discharge Instructions (Addendum)
We evaluated you for your cough.  Your x-ray does show a pneumonia.  I have prescribed you an antibiotic.  Please take this as prescribed.  Please follow-up with your primary doctor for repeat chest x-ray to make sure that your pneumonia has improved.  If you have any new or worsening symptoms such as difficulty breathing, chest pain, lightheadedness or dizziness, fainting, or any other concerning new symptoms, please return to the emergency department.

## 2022-12-03 ENCOUNTER — Ambulatory Visit: Payer: Medicaid Other | Admitting: Family Medicine

## 2022-12-11 ENCOUNTER — Ambulatory Visit (INDEPENDENT_AMBULATORY_CARE_PROVIDER_SITE_OTHER): Payer: Medicaid Other | Admitting: Family Medicine

## 2022-12-11 ENCOUNTER — Encounter: Payer: Self-pay | Admitting: Family Medicine

## 2022-12-11 VITALS — BP 132/80 | HR 95 | Ht <= 58 in | Wt 191.1 lb

## 2022-12-11 DIAGNOSIS — E7849 Other hyperlipidemia: Secondary | ICD-10-CM

## 2022-12-11 DIAGNOSIS — R7301 Impaired fasting glucose: Secondary | ICD-10-CM

## 2022-12-11 DIAGNOSIS — Z114 Encounter for screening for human immunodeficiency virus [HIV]: Secondary | ICD-10-CM

## 2022-12-11 DIAGNOSIS — J302 Other seasonal allergic rhinitis: Secondary | ICD-10-CM | POA: Diagnosis not present

## 2022-12-11 DIAGNOSIS — Z23 Encounter for immunization: Secondary | ICD-10-CM | POA: Diagnosis not present

## 2022-12-11 DIAGNOSIS — F411 Generalized anxiety disorder: Secondary | ICD-10-CM | POA: Diagnosis not present

## 2022-12-11 DIAGNOSIS — J4540 Moderate persistent asthma, uncomplicated: Secondary | ICD-10-CM | POA: Diagnosis not present

## 2022-12-11 DIAGNOSIS — Z1159 Encounter for screening for other viral diseases: Secondary | ICD-10-CM

## 2022-12-11 DIAGNOSIS — Z3169 Encounter for other general counseling and advice on procreation: Secondary | ICD-10-CM | POA: Diagnosis not present

## 2022-12-11 DIAGNOSIS — E038 Other specified hypothyroidism: Secondary | ICD-10-CM

## 2022-12-11 DIAGNOSIS — E559 Vitamin D deficiency, unspecified: Secondary | ICD-10-CM

## 2022-12-11 MED ORDER — ALBUTEROL SULFATE HFA 108 (90 BASE) MCG/ACT IN AERS: 2.0000 | INHALATION_SPRAY | Freq: Four times a day (QID) | RESPIRATORY_TRACT | 2 refills | Status: DC | PRN

## 2022-12-11 MED ORDER — MONTELUKAST SODIUM 10 MG PO TABS: 10.0000 mg | ORAL_TABLET | Freq: Every day | ORAL | 1 refills | Status: DC

## 2022-12-11 MED ORDER — SYMBICORT 160-4.5 MCG/ACT IN AERO: 2.0000 | INHALATION_SPRAY | Freq: Two times a day (BID) | RESPIRATORY_TRACT | 2 refills | Status: DC | PRN

## 2022-12-11 MED ORDER — HYDROXYZINE PAMOATE 25 MG PO CAPS: 25.0000 mg | ORAL_CAPSULE | Freq: Three times a day (TID) | ORAL | 1 refills | Status: AC | PRN

## 2022-12-11 NOTE — Patient Instructions (Addendum)
I appreciate the opportunity to provide care to you today!    Follow up:  6 weeks for anxiety  Labs: please stop by the lab today to get your blood drawn (CBC, CMP, TSH, Lipid profile, HgA1c, Vit D)  Screening: HIV and Hep C  Thank you for getting your flu vaccine Please pick up your refills at the pharmacy  Nonpharmacologic management of anxiety and depression  Mindfulness and Meditation Practices like mindfulness meditation can help reduce symptoms by promoting relaxation and present-moment awareness.  Exercise  Regular physical activity has been shown to improve mood and reduce anxiety through the release of endorphins and other neurochemicals.  Healthy Diet Eating a balanced diet rich in fruits, vegetables, whole grains, and lean proteins can support overall mental health.  Sleep Hygiene  Establishing a regular sleep routine and ensuring good sleep quality can significantly impact mood and anxiety levels.  Stress Management Techniques Activities such as yoga, tai chi, and deep breathing exercises can help manage stress.  Social Support Maintaining strong relationships and seeking support from friends, family, or support groups can provide emotional comfort and reduce feelings of isolation.  Lifestyle Modifications Reducing alcohol and caffeine intake, quitting smoking, and avoiding recreational drugs can improve symptoms.  Art and Music Therapy Engaging in creative activities like painting, drawing, or playing music can be therapeutic and help express emotions.  Light Therapy Particularly useful for seasonal affective disorder (SAD), exposure to bright light can help regulate mood. Marland Kitchen    Referrals today-  OBGYN for conception counseling  . Please continue to a heart-healthy diet and increase your physical activities. Try to exercise for at least five days a week.    It was a pleasure to see you and I look forward to continuing to work together on your health and  well-being. Please do not hesitate to call the office if you need care or have questions about your care.  In case of emergency, please visit the Emergency Department for urgent care, or contact our clinic at [phone] to schedule an appointment. We're here to help you!   Have a wonderful day and week. With Gratitude, Gilmore Laroche MSN, FNP-BC

## 2022-12-11 NOTE — Assessment & Plan Note (Addendum)
UA shows a trace of leukocytes Likely due to asymptomatic bacteriuria as the patient is not symptomatic  Negative pregnancy test noted Referral placed to GYN for preconception counseling

## 2022-12-11 NOTE — Assessment & Plan Note (Signed)
Refill Singulair 10 mg daily
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
## 2022-12-11 NOTE — Assessment & Plan Note (Signed)
Refill Symbicort and albuterol inhaler

## 2022-12-11 NOTE — Assessment & Plan Note (Signed)
Patient educated on CDC recommendation for the vaccine. Verbal consent was obtained from the patient, vaccine administered by nurse, no sign of adverse reactions noted at this time. Patient education on arm soreness and use of tylenol or ibuprofen for this patient  was discussed. Patient educated on the signs and symptoms of adverse effect and advise to contact the office if they occur.  

## 2022-12-11 NOTE — Assessment & Plan Note (Signed)
Denies suicidal thoughts or ideation. Will initiate therapy with hydroxyzine 25 mg, to be taken every 8 hours as needed for anxiety. Reviewed nonpharmacological management of anxiety, including meditation, deep breathing exercises, mindfulness, maintaining a well-balanced diet, and ensuring adequate sleep. The patient verbalized understanding. Follow-up scheduled in 6 weeks to assess treatment effectiveness.

## 2022-12-12 ENCOUNTER — Other Ambulatory Visit: Payer: Self-pay | Admitting: Family Medicine

## 2022-12-12 DIAGNOSIS — E559 Vitamin D deficiency, unspecified: Secondary | ICD-10-CM

## 2022-12-12 DIAGNOSIS — E781 Pure hyperglyceridemia: Secondary | ICD-10-CM

## 2022-12-12 MED ORDER — ROSUVASTATIN CALCIUM 10 MG PO TABS: 10.0000 mg | ORAL_TABLET | Freq: Every day | ORAL | 3 refills | Status: AC

## 2022-12-12 MED ORDER — VITAMIN D (ERGOCALCIFEROL) 1.25 MG (50000 UNIT) PO CAPS: 50000.0000 [IU] | ORAL_CAPSULE | ORAL | 1 refills | Status: AC

## 2022-12-12 NOTE — Progress Notes (Signed)
Please inform the patient that her triglyceride levels are elevated, and a prescription for rosuvastatin 10 mg daily has been sent to her pharmacy. I recommend taking over-the-counter fish oil 2000 mg twice daily. Additionally, I advise decreasing her intake of greasy, fatty, and starchy foods and increasing physical activity. Her hemoglobin A1c is 6.0, indicating that she is prediabetic. I recommend reducing her intake of high-sugar foods and beverages and increasing physical activity.  A prescription for a weekly vitamin D supplement has been sent to her pharmacy because her vitamin D level is low

## 2022-12-13 LAB — CMP14+EGFR
ALT: 20 [IU]/L (ref 0–32)
AST: 18 [IU]/L (ref 0–40)
Albumin: 4.8 g/dL (ref 4.0–5.0)
Alkaline Phosphatase: 81 [IU]/L (ref 44–121)
BUN/Creatinine Ratio: 33 — ABNORMAL HIGH (ref 9–23)
BUN: 19 mg/dL (ref 6–20)
Bilirubin Total: <0.2 mg/dL (ref 0.0–1.2)
CO2: 19 mmol/L — ABNORMAL LOW (ref 20–29)
Calcium: 9.6 mg/dL (ref 8.7–10.2)
Chloride: 100 mmol/L (ref 96–106)
Creatinine, Ser: 0.58 mg/dL (ref 0.57–1.00)
Globulin, Total: 2.6 g/dL (ref 1.5–4.5)
Glucose: 119 mg/dL — ABNORMAL HIGH (ref 70–99)
Potassium: 4.8 mmol/L (ref 3.5–5.2)
Sodium: 138 mmol/L (ref 134–144)
Total Protein: 7.4 g/dL (ref 6.0–8.5)
eGFR: 127 mL/min/{1.73_m2} (ref >59)

## 2022-12-13 LAB — CBC WITH DIFFERENTIAL/PLATELET
Basophils Absolute: 0 10*3/uL (ref 0.0–0.2)
Basos: 1 %
EOS (ABSOLUTE): 0.4 10*3/uL (ref 0.0–0.4)
Eos: 5 %
Hematocrit: 45.2 % (ref 34.0–46.6)
Hemoglobin: 14.7 g/dL (ref 11.1–15.9)
Immature Grans (Abs): 0 10*3/uL (ref 0.0–0.1)
Immature Granulocytes: 1 %
Lymphocytes Absolute: 2 10*3/uL (ref 0.7–3.1)
Lymphs: 24 %
MCH: 27.1 pg (ref 26.6–33.0)
MCHC: 32.5 g/dL (ref 31.5–35.7)
MCV: 83 fL (ref 79–97)
Monocytes Absolute: 0.5 10*3/uL (ref 0.1–0.9)
Monocytes: 6 %
Neutrophils Absolute: 5.2 10*3/uL (ref 1.4–7.0)
Neutrophils: 63 %
Platelets: 320 10*3/uL (ref 150–450)
RBC: 5.43 x10E6/uL — ABNORMAL HIGH (ref 3.77–5.28)
RDW: 13.7 % (ref 11.7–15.4)
WBC: 8.2 10*3/uL (ref 3.4–10.8)

## 2022-12-13 LAB — URINALYSIS
Bilirubin, UA: NEGATIVE
Glucose, UA: NEGATIVE
Ketones, UA: NEGATIVE
Nitrite, UA: NEGATIVE
Protein,UA: NEGATIVE
Specific Gravity, UA: 1.03 (ref 1.005–1.030)
Urobilinogen, Ur: 0.2 mg/dL (ref 0.2–1.0)
pH, UA: 5.5 (ref 5.0–7.5)

## 2022-12-13 LAB — VITAMIN D 25 HYDROXY (VIT D DEFICIENCY, FRACTURES): Vit D, 25-Hydroxy: 10.1 ng/mL — ABNORMAL LOW (ref 30.0–100.0)

## 2022-12-13 LAB — LIPID PANEL
Chol/HDL Ratio: 10.7 ratio — ABNORMAL HIGH (ref 0.0–4.4)
Cholesterol, Total: 311 mg/dL — ABNORMAL HIGH (ref 100–199)
HDL: 29 mg/dL — ABNORMAL LOW (ref >39)
Triglycerides: 1215 mg/dL (ref 0–149)

## 2022-12-13 LAB — TSH+FREE T4
Free T4: 1.16 ng/dL (ref 0.82–1.77)
TSH: 1.87 u[IU]/mL (ref 0.450–4.500)

## 2022-12-13 LAB — PREGNANCY, URINE: Preg Test, Ur: NEGATIVE

## 2022-12-13 LAB — HEPATITIS C ANTIBODY: Hep C Virus Ab: NONREACTIVE

## 2022-12-13 LAB — HIV ANTIBODY (ROUTINE TESTING W REFLEX): HIV Screen 4th Generation wRfx: NONREACTIVE

## 2022-12-13 LAB — HEMOGLOBIN A1C
Est. average glucose Bld gHb Est-mCnc: 126 mg/dL
Hgb A1c MFr Bld: 6 % — ABNORMAL HIGH (ref 4.8–5.6)

## 2022-12-25 ENCOUNTER — Ambulatory Visit: Payer: Medicaid Other | Admitting: Family Medicine

## 2023-01-30 ENCOUNTER — Ambulatory Visit: Payer: Medicaid Other | Admitting: Family Medicine

## 2023-02-03 ENCOUNTER — Ambulatory Visit: Payer: Medicaid Other | Admitting: Obstetrics and Gynecology

## 2023-02-19 ENCOUNTER — Other Ambulatory Visit: Payer: Self-pay

## 2023-02-19 ENCOUNTER — Emergency Department (HOSPITAL_COMMUNITY)
Admission: EM | Admit: 2023-02-19 | Discharge: 2023-02-19 | Disposition: A | Discharge status: Home / Self Care | Payer: Medicaid Other | Attending: Emergency Medicine | Admitting: Emergency Medicine

## 2023-02-19 ENCOUNTER — Emergency Department (HOSPITAL_COMMUNITY): Payer: Medicaid Other

## 2023-02-19 ENCOUNTER — Encounter (HOSPITAL_COMMUNITY): Payer: Self-pay

## 2023-02-19 DIAGNOSIS — Z1152 Encounter for screening for COVID-19: Secondary | ICD-10-CM | POA: Insufficient documentation

## 2023-02-19 DIAGNOSIS — R0981 Nasal congestion: Secondary | ICD-10-CM | POA: Diagnosis present

## 2023-02-19 DIAGNOSIS — J069 Acute upper respiratory infection, unspecified: Secondary | ICD-10-CM | POA: Insufficient documentation

## 2023-02-19 DIAGNOSIS — J45909 Unspecified asthma, uncomplicated: Secondary | ICD-10-CM | POA: Diagnosis not present

## 2023-02-19 LAB — PREGNANCY, URINE: Preg Test, Ur: NEGATIVE

## 2023-02-19 LAB — URINALYSIS, ROUTINE W REFLEX MICROSCOPIC
Bacteria, UA: NONE SEEN
Bilirubin Urine: NEGATIVE
Glucose, UA: NEGATIVE mg/dL
Ketones, ur: NEGATIVE mg/dL
Leukocytes,Ua: NEGATIVE
Nitrite: NEGATIVE
Protein, ur: NEGATIVE mg/dL
Specific Gravity, Urine: 1.02 (ref 1.005–1.030)
pH: 5 (ref 5.0–8.0)

## 2023-02-19 LAB — RESP PANEL BY RT-PCR (RSV, FLU A&B, COVID)  RVPGX2
Influenza A by PCR: NEGATIVE
Influenza B by PCR: NEGATIVE
Resp Syncytial Virus by PCR: NEGATIVE
SARS Coronavirus 2 by RT PCR: NEGATIVE

## 2023-02-19 MED ORDER — ALBUTEROL SULFATE (2.5 MG/3ML) 0.083% IN NEBU
2.5000 mg | INHALATION_SOLUTION | Freq: Once | RESPIRATORY_TRACT | Status: AC
  Administered 2023-02-19: 2.5 mg via RESPIRATORY_TRACT
  Filled 2023-02-19: qty 3

## 2023-02-19 MED ORDER — PREDNISONE 10 MG PO TABS: 40.0000 mg | ORAL_TABLET | Freq: Every day | ORAL | 0 refills | Status: AC

## 2023-02-19 NOTE — ED Provider Notes (Signed)
[place] EMERGENCY DEPARTMENT AT [facility] Provider Note   CSN: [id number] Arrival date & time: 02/19/23  4098     History  Chief Complaint  Patient presents with   Cough   Nasal Congestion    Jo Martin is a 28 y.o. female.  HPI Patient presents for cough and congestion.  Medical history includes asthma, anxiety.  Cough has been present for the past 3 days.  She has been using her albuterol inhaler at home.  She denies any fevers or chills.  She had a recent irregular period and thinks she may be pregnant.  She has been trying to get pregnant.  She denies any recent abdominal pain.    Home Medications Prior to Admission medications   Medication Sig Start Date End Date Taking? Authorizing Provider  acetaminophen (TYLENOL) 500 MG tablet Take 1,000 mg by mouth every 6 (six) hours as needed for moderate pain.    [provider]  albuterol (VENTOLIN HFA) 108 (90 Base) MCG/ACT inhaler Inhale 2 puffs into the lungs every 6 (six) hours as needed for wheezing or shortness of breath. 12/11/22   Gilmore Laroche, FNP  hydrOXYzine (VISTARIL) 25 MG capsule Take 1 capsule (25 mg total) by mouth every 8 (eight) hours as needed for anxiety. 12/11/22   Gilmore Laroche, FNP  montelukast (SINGULAIR) 10 MG tablet Take 1 tablet (10 mg total) by mouth at bedtime. 12/11/22   Gilmore Laroche, FNP  rosuvastatin (CRESTOR) 10 MG tablet Take 1 tablet (10 mg total) by mouth daily. 12/12/22   Gilmore Laroche, FNP  SYMBICORT 160-4.5 MCG/ACT inhaler Inhale 2 puffs into the lungs 2 (two) times daily as needed. 12/11/22   Gilmore Laroche, FNP  Vitamin D, Ergocalciferol, (DRISDOL) 1.25 MG (50000 UNIT) CAPS capsule Take 1 capsule (50,000 Units total) by mouth every 7 (seven) days. 12/12/22   Gilmore Laroche, FNP      Allergies    Penicillins, Sulfa antibiotics, and Augmentin [amoxicillin-pot clavulanate]    Review of Systems   Review of Systems  HENT:  Positive for congestion.    Respiratory:  Positive for cough and chest tightness.   All other systems reviewed and are negative.   Physical Exam Updated Vital Signs BP (!) 165/96 (BP Location: Left Arm)   Pulse 97   Temp 98 F (36.7 C) (Oral)   Resp 20   Ht 4\' 7"  (1.397 m)   Wt 79.4 kg   LMP 02/13/2023 (Exact Date)   SpO2 95%   BMI 40.67 kg/m  Physical Exam Vitals and nursing note reviewed.  Constitutional:      General: She is not in acute distress.    Appearance: Normal appearance. She is well-developed. She is not ill-appearing, toxic-appearing or diaphoretic.  HENT:     Head: Normocephalic and atraumatic.     Right Ear: External ear normal.     Left Ear: External ear normal.     Nose: Nose normal.     Mouth/Throat:     Mouth: Mucous membranes are moist.  Eyes:     Extraocular Movements: Extraocular movements intact.     Conjunctiva/sclera: Conjunctivae normal.  Cardiovascular:     Rate and Rhythm: Normal rate and regular rhythm.  Pulmonary:     Effort: Pulmonary effort is normal. No tachypnea or respiratory distress.     Breath sounds: Normal breath sounds. Decreased air movement present. No wheezing, rhonchi or rales.  Abdominal:     Palpations: Abdomen is soft.     Tenderness: There  is no abdominal tenderness.  Musculoskeletal:        General: No swelling. Normal range of motion.     Cervical back: Normal range of motion and neck supple.     Right lower leg: No edema.     Left lower leg: No edema.  Skin:    General: Skin is warm and dry.     Coloration: Skin is not jaundiced or pale.  Neurological:     General: No focal deficit present.     Mental Status: She is alert and oriented to person, place, and time.  Psychiatric:        Mood and Affect: Mood normal.        Behavior: Behavior normal.     ED Results / Procedures / Treatments   Labs (all labs ordered are listed, but only abnormal results are displayed) Labs Reviewed  URINALYSIS, ROUTINE W REFLEX MICROSCOPIC - Abnormal;  Notable for the following components:      Result Value   Hgb urine dipstick SMALL (*)    All other components within normal limits  RESP PANEL BY RT-PCR (RSV, FLU A&B, COVID)  RVPGX2  PREGNANCY, URINE    EKG None  Radiology DG Chest Portable 1 View Result Date: 02/19/2023 CLINICAL DATA:  Cough EXAM: PORTABLE CHEST 1 VIEW COMPARISON:  11/29/2022 FINDINGS: Status post median sternotomy. Heart size normal. Both lungs are clear. The visualized skeletal structures are unremarkable. IMPRESSION: No acute abnormality of the lungs in AP portable projection. Electronically Signed   By: Jearld Lesch M.D.   On: 02/19/2023 08:54    Procedures Procedures    Medications Ordered in ED Medications  albuterol (PROVENTIL) (2.5 MG/3ML) 0.083% nebulizer solution 2.5 mg (2.5 mg Nebulization Given 02/19/23 0841)    ED Course/ Medical Decision Making/ A&P                                 Medical Decision Making Amount and/or Complexity of Data Reviewed Labs: ordered. Radiology: ordered.  Risk Prescription drug management.   Patient presents for recent cough and congestion.  She does have history of asthma has had some chest tightness as well.  On arrival, patient is well-appearing.  Her breathing is unlabored and she is able to speak in complete sentences.  On lung auscultation, she has mildly diminished air movement.  Nebulized albuterol was ordered.  Will check nasal swab and x-ray.  Additional patient concerned as recent irregular.  In she would like to know if she is pregnant.  Urine pregnancy test was ordered.  Urine pregnancy was negative.  Chest x-ray showed no acute findings.  Patient was advised to follow-up on results of nasal swab testing.  She was prescribed prednisone.  She was discharged in stable condition.        Final Clinical Impression(s) / ED Diagnoses Final diagnoses:  Viral URI with cough    Rx / DC Orders ED Discharge Orders     None         Gloris Manchester,  MD 02/19/23 [phone]

## 2023-02-19 NOTE — ED Triage Notes (Signed)
Patient has had cough and congestion for about 3 days with decreased appetite without fever. Patient also concerned about not having a period or not.

## 2023-02-19 NOTE — Discharge Instructions (Addendum)
COVID/flu/RSV testing were negative.  I suspect other viral upper respiratory infection.  Take ibuprofen and Tylenol as needed for fevers or soreness.  Use albuterol inhaler as needed for chest tightness and cough.  A prescription for steroid was sent to your pharmacy.  This will help with chest tightness.  Return to the emergency department for any new or worsening symptoms of concern.

## 2023-02-26 NOTE — Patient Instructions (Signed)

## 2023-02-26 NOTE — Progress Notes (Signed)
Established Patient Office Visit   Subjective  Patient ID: Jo Martin, female    DOB: 02-10-1995  Age: 28 y.o. MRN: [id number]  Chief Complaint  Patient presents with   Follow-up    6 wk. F/u for anxiety   ED f/u for sinus infection . Still experiencing symptoms of cough, congestion sinus pressure.     She  has a past medical history of History of open heart surgery.  Cough Evaluation The patient presents with complaints of persistent cough accompanied by sinus congestion, shortness of breath at rest, fatigue, headache, malaise, myalgias, sore throat, and wheezing. Symptoms began several days ago and have progressively worsened. The patient denies chest pain or vomiting. Current treatments include OTC analgesics/antipyretics, which have provided minimal relief, and a course of Prednisone with no relief. Past pulmonary history is notable for pneumonia.  Anxiety Follow-Up The patient reports ongoing symptoms of excessive worry, irritability, muscle tension, panic, and restlessness, occurring most days. The severity of these symptoms causes significant distress and interferes with daily functioning. The patient averages only four hours of sleep per night with frequent awakenings. Reported side effects of treatment include headaches. Patient has tried hydroxyzine 25 mg with no improvement.            Review of Systems  Constitutional:  Negative for chills and fever.  HENT:  Positive for congestion, sinus pain and sore throat.   Respiratory:  Positive for cough, sputum production, shortness of breath and wheezing.   Cardiovascular:  Negative for chest pain.  Musculoskeletal:  Positive for back pain and myalgias.  Neurological:  Negative for dizziness and headaches.      Objective:     BP 108/73   Pulse 99   Ht 4\' 7"  (1.397 m)   Wt 182 lb (82.6 kg)   LMP 02/18/2023 (Approximate)   SpO2 92%   BMI 42.30 kg/m  BP Readings from Last 3 Encounters:  02/28/23 108/73   02/19/23 (!) 142/103  12/11/22 132/80      Physical Exam Vitals reviewed.  Constitutional:      General: She is not in acute distress.    Appearance: Normal appearance. She is not ill-appearing, toxic-appearing or diaphoretic.  HENT:     Head: Normocephalic.     Right Ear: Tympanic membrane normal.     Left Ear: Tympanic membrane normal.     Nose: Congestion and rhinorrhea present.     Mouth/Throat:     Pharynx: Posterior oropharyngeal erythema present.  Eyes:     General:        Right eye: No discharge.        Left eye: No discharge.     Conjunctiva/sclera: Conjunctivae normal.  Cardiovascular:     Rate and Rhythm: Normal rate.     Pulses: Normal pulses.     Heart sounds: Normal heart sounds.  Pulmonary:     Effort: Pulmonary effort is normal. No respiratory distress.     Breath sounds: Wheezing and rhonchi present.  Musculoskeletal:        General: Normal range of motion.     Cervical back: Normal range of motion.  Skin:    General: Skin is warm and dry.     Capillary Refill: Capillary refill takes less than 2 seconds.  Neurological:     General: No focal deficit present.     Mental Status: She is alert and oriented to person, place, and time.     Coordination: Coordination normal.     Gait:  Gait normal.  Psychiatric:        Mood and Affect: Mood normal.        Behavior: Behavior normal.      No results found for any visits on 02/28/23.  The ASCVD Risk score (Arnett DK, et al., 2019) failed to calculate for the following reasons:   The 2019 ASCVD risk score is only valid for ages 56 to 43    Assessment & Plan:  Anxiety Assessment & Plan:    12/11/2022    9:18 AM 12/11/2022    9:02 AM  GAD 7 : Generalized Anxiety Score  Nervous, Anxious, on Edge 2 0  Control/stop worrying 2 0  Worry too much - different things 3 0  Trouble relaxing 2 0  Restless 1 0  Easily annoyed or irritable 3 0  Afraid - awful might happen 2 0  Total GAD 7 Score 15 0   Anxiety Difficulty Somewhat difficult Not difficult at all     Trial on Lexapro 10 mg once daily We discussed several non-pharmacological approaches to managing anxiety including:  Establishing a consistent daily routine: This helps create structure and stability. Practicing mindfulness and relaxation techniques: Incorporating meditation, deep breathing exercises, or yoga to manage stress and improve emotional well-being. Engaging in regular physical activity: Aim for at least 30 minutes of exercise most days to boost mood and energy levels. Spending time outdoors: Exposure to natural light and fresh air can improve mental health. Building a support network: Encouraging social connections with friends, family, or support groups to reduce feelings of isolation. Prioritizing a balanced diet: Eating nutrient-rich foods while avoiding excessive amounts of processed foods, sugar, and unhealthy fats. Follow-up is recommended in 4-8 weeks to assess progress, with a referral to behavioral health for further support if needed.    Upper respiratory tract infection, unspecified type Assessment & Plan: Azithromycin 250 mg twice daily x 5 days,  promethazine syrup PRN Advise patient to rest to support your body's recovery. Stay hydrated by drinking water, tea, or broth. Using a humidifier can help soothe throat irritation and ease nasal congestion. For fever or pain, acetaminophen (Tylenol) is recommended. To relieve other symptoms, try saline nasal sprays, throat lozenges, or gargling with saltwater. Focus on eating light, healthy meals like fruits and vegetables to keep your strength up. Practice good hygiene by washing your hands frequently and covering your mouth when coughing or sneezing.Follow-up for worsening or persistent symptoms. Patient verbalizes understanding regarding plan of care and all questions answered     Other orders -     Azithromycin; Take 2 tablets on day 1, then 1 tablet daily  on days 2 through 5  Dispense: 6 tablet; Refill: 0 -     Promethazine-DM; Take 5 mLs by mouth 4 (four) times daily as needed.  Dispense: 118 mL; Refill: 0 -     Escitalopram Oxalate; Take 1 tablet (10 mg total) by mouth daily.  Dispense: 30 tablet; Refill: 3 -     Cyclobenzaprine HCl; Take 1 tablet (5 mg total) by mouth 3 (three) times daily as needed for muscle spasms.  Dispense: 30 tablet; Refill: 1    Return in about 6 weeks (around 04/11/2023), or if symptoms worsen or fail to improve, for Anxiety, medication managment, Weight Loss Mangment.   Cruzita Lederer Newman Nip, FNP

## 2023-02-28 ENCOUNTER — Ambulatory Visit (INDEPENDENT_AMBULATORY_CARE_PROVIDER_SITE_OTHER): Payer: Medicaid Other | Admitting: Family Medicine

## 2023-02-28 ENCOUNTER — Telehealth: Payer: Self-pay | Admitting: Family Medicine

## 2023-02-28 VITALS — BP 108/73 | HR 99 | Ht <= 58 in | Wt 182.0 lb

## 2023-02-28 DIAGNOSIS — J069 Acute upper respiratory infection, unspecified: Secondary | ICD-10-CM | POA: Insufficient documentation

## 2023-02-28 DIAGNOSIS — F419 Anxiety disorder, unspecified: Secondary | ICD-10-CM | POA: Diagnosis not present

## 2023-02-28 MED ORDER — PROMETHAZINE-DM 6.25-15 MG/5ML PO SYRP: 5.0000 mL | ORAL_SOLUTION | Freq: Four times a day (QID) | ORAL | 0 refills | Status: DC | PRN

## 2023-02-28 MED ORDER — ESCITALOPRAM OXALATE 10 MG PO TABS: 10.0000 mg | ORAL_TABLET | Freq: Every day | ORAL | 3 refills | Status: AC

## 2023-02-28 MED ORDER — CYCLOBENZAPRINE HCL 5 MG PO TABS: 5.0000 mg | ORAL_TABLET | Freq: Three times a day (TID) | ORAL | 1 refills | Status: AC | PRN

## 2023-02-28 MED ORDER — AZITHROMYCIN 250 MG PO TABS: ORAL_TABLET | ORAL | 0 refills | Status: AC

## 2023-02-28 NOTE — Telephone Encounter (Signed)
Disability placard  Noted  Copied Sleeved  Original in PCP box Copy front desk folder

## 2023-02-28 NOTE — Assessment & Plan Note (Signed)
 Azithromycin 250 mg twice daily x 5 days, promethazine syrup PRN Advise patient to rest to support your body's recovery. Stay hydrated by drinking water, tea, or broth. Using a humidifier can help soothe throat irritation and ease nasal congestion. For fever or pain, acetaminophen (Tylenol) is recommended. To relieve other symptoms, try saline nasal sprays, throat lozenges, or gargling with saltwater. Focus on eating light, healthy meals like fruits and vegetables to keep your strength up. Practice good hygiene by washing your hands frequently and covering your mouth when coughing or sneezing.Follow-up for worsening or persistent symptoms. Patient verbalizes understanding regarding plan of care and all questions answered

## 2023-02-28 NOTE — Assessment & Plan Note (Signed)
    12/11/2022    9:18 AM 12/11/2022    9:02 AM  GAD 7 : Generalized Anxiety Score  Nervous, Anxious, on Edge 2 0  Control/stop worrying 2 0  Worry too much - different things 3 0  Trouble relaxing 2 0  Restless 1 0  Easily annoyed or irritable 3 0  Afraid - awful might happen 2 0  Total GAD 7 Score 15 0  Anxiety Difficulty Somewhat difficult Not difficult at all     Trial on Lexapro 10 mg once daily We discussed several non-pharmacological approaches to managing anxiety including:  Establishing a consistent daily routine: This helps create structure and stability. Practicing mindfulness and relaxation techniques: Incorporating meditation, deep breathing exercises, or yoga to manage stress and improve emotional well-being. Engaging in regular physical activity: Aim for at least 30 minutes of exercise most days to boost mood and energy levels. Spending time outdoors: Exposure to natural light and fresh air can improve mental health. Building a support network: Encouraging social connections with friends, family, or support groups to reduce feelings of isolation. Prioritizing a balanced diet: Eating nutrient-rich foods while avoiding excessive amounts of processed foods, sugar, and unhealthy fats. Follow-up is recommended in 4-8 weeks to assess progress, with a referral to behavioral health for further support if needed.

## 2023-03-06 ENCOUNTER — Other Ambulatory Visit: Payer: Self-pay | Admitting: Family Medicine

## 2023-03-11 ENCOUNTER — Ambulatory Visit: Payer: Medicaid Other | Admitting: Family Medicine

## 2023-03-14 ENCOUNTER — Telehealth: Payer: Self-pay | Admitting: Family Medicine

## 2023-03-14 NOTE — Telephone Encounter (Signed)
Patient wants to switch pcp to [facility]  Please advise

## 2023-03-17 ENCOUNTER — Other Ambulatory Visit: Payer: Self-pay

## 2023-03-18 ENCOUNTER — Ambulatory Visit: Payer: Medicaid Other | Admitting: Obstetrics & Gynecology

## 2023-03-18 ENCOUNTER — Encounter: Payer: Self-pay | Admitting: Obstetrics & Gynecology

## 2023-03-18 VITALS — BP 126/80 | HR 93 | Ht <= 58 in | Wt 182.0 lb

## 2023-03-18 DIAGNOSIS — Z3202 Encounter for pregnancy test, result negative: Secondary | ICD-10-CM | POA: Diagnosis not present

## 2023-03-18 DIAGNOSIS — Z01419 Encounter for gynecological examination (general) (routine) without abnormal findings: Secondary | ICD-10-CM | POA: Diagnosis not present

## 2023-03-18 DIAGNOSIS — Z1339 Encounter for screening examination for other mental health and behavioral disorders: Secondary | ICD-10-CM | POA: Diagnosis not present

## 2023-03-18 MED ORDER — VITAFOL GUMMIES 3.33-0.333-34.8 MG PO CHEW: 1.0000 | CHEWABLE_TABLET | Freq: Every day | ORAL | 5 refills | Status: DC

## 2023-03-18 NOTE — Progress Notes (Addendum)
 28 y.o. New GYN presents for conception, she has been trying to get pregnant for 2+ months.  Last PAP 8/07/202  UPT negative

## 2023-03-18 NOTE — Progress Notes (Signed)
 GYNECOLOGY CLINIC ANNUAL PREVENTATIVE CARE ENCOUNTER NOTE  Subjective:   Jo Martin is a 28 y.o. G20P1101 female here for a routine annual gynecologic exam.  Current complaints: no .   Denies abnormal vaginal bleeding, discharge, pelvic pain, problems with intercourse or other gynecologic concerns.    Gynecologic History Patient's last menstrual period was 02/11/2023 (exact date). Contraception:  Last Pap: 2023. Results were: normal   Obstetric History OB History  Gravida Para Term Preterm AB Living  2 2 1 1  0 1  SAB IAB Ectopic Multiple Live Births  0    1    # Outcome Date GA Lbr Len/2nd Weight Sex Type Anes PTL Lv  2 Term 06/28/17    M CS-LTranv   LIV  1 Preterm 10/05/14 [redacted]w[redacted]d    CS-Vac   FD    Past Medical History:  Diagnosis Date   Anxiety    Asthma    Depression    History of open heart surgery     Past Surgical History:  Procedure Laterality Date   CESAREAN SECTION     cochlear implants x2     open heart surgery      Current Outpatient Medications on File Prior to Visit  Medication Sig Dispense Refill   acetaminophen (TYLENOL) 500 MG tablet Take 1,000 mg by mouth every 6 (six) hours as needed for moderate pain.     albuterol (VENTOLIN HFA) 108 (90 Base) MCG/ACT inhaler Inhale 2 puffs into the lungs every 6 (six) hours as needed for wheezing or shortness of breath. 8 g 2   cyclobenzaprine (FLEXERIL) 5 MG tablet Take 1 tablet (5 mg total) by mouth 3 (three) times daily as needed for muscle spasms. 30 tablet 1   SYMBICORT 160-4.5 MCG/ACT inhaler Inhale 2 puffs into the lungs 2 (two) times daily as needed. 10.2 g 2   azithromycin (ZITHROMAX) 250 MG tablet Take 2 tablets on day 1, then 1 tablet daily on days 2 through 5 6 tablet 0   escitalopram (LEXAPRO) 10 MG tablet Take 1 tablet (10 mg total) by mouth daily. 30 tablet 3   hydrOXYzine (VISTARIL) 25 MG capsule Take 1 capsule (25 mg total) by mouth every 8 (eight) hours as needed for anxiety. 30 capsule 1    montelukast (SINGULAIR) 10 MG tablet Take 1 tablet (10 mg total) by mouth at bedtime. 90 tablet 1   promethazine-dextromethorphan (PROMETHAZINE-DM) 6.25-15 MG/5ML syrup Take 5 mLs by mouth 4 (four) times daily as needed. 118 mL 0   rosuvastatin (CRESTOR) 10 MG tablet Take 1 tablet (10 mg total) by mouth daily. 90 tablet 3   Vitamin D, Ergocalciferol, (DRISDOL) 1.25 MG (50000 UNIT) CAPS capsule Take 1 capsule (50,000 Units total) by mouth every 7 (seven) days. 20 capsule 1   No current facility-administered medications on file prior to visit.    Allergies  Allergen Reactions   Amoxicillin-Pot Clavulanate Rash and Shortness Of Breath   Penicillins Shortness Of Breath   Sulfa Antibiotics Shortness Of Breath and Anaphylaxis   Sulfasalazine Shortness Of Breath    Social History   Socioeconomic History   Marital status: Single    Spouse name: Not on file   Number of children: 1   Years of education: Not on file   Highest education level: Not on file  Occupational History   Not on file  Tobacco Use   Smoking status: Never   Smokeless tobacco: Never  Vaping Use   Vaping status: Never Used  Substance and  Sexual Activity   Alcohol use: No   Drug use: No   Sexual activity: Yes    Birth control/protection: None  Other Topics Concern   Not on file  Social History Narrative   Not on file   Social Drivers of Health   Financial Resource Strain: Not on file  Food Insecurity: No Food Insecurity (03/26/2021)   Received from [facility], [facility]   Hunger Vital Sign    Worried About Running Out of Food in the Last Year: Never true    Ran Out of Food in the Last Year: Never true  Transportation Needs: Not on file  Physical Activity: Not on file  Stress: Not on file  Social Connections: Unknown (06/08/2021)   Received from [facility], [facility]   Social Network    Social Network: Not on file  Intimate Partner Violence: Unknown (05/01/2021)   Received from [facility],  [facility]   HITS    Physically Hurt: Not on file    Insult or Talk Down To: Not on file    Threaten Physical Harm: Not on file    Scream or Curse: Not on file    Family History  Problem Relation Age of Onset   Cancer Paternal Aunt     The following portions of the patient's history were reviewed and updated as appropriate: allergies, current medications, past family history, past medical history, past social history, past surgical history and problem list.  Review of Systems Pertinent items are noted in HPI.   Objective:  BP 126/80   Pulse 93   Ht 4\' 7"  (1.397 m)   Wt 182 lb (82.6 kg)   LMP 02/11/2023 (Exact Date)   BMI 42.30 kg/m  CONSTITUTIONAL: Well-developed, well-nourished female in no acute distress.  HENT:  Normocephalic, atraumatic, External right and left ear normal. Oropharynx is clear and moist EYES: Conjunctivae and EOM are normal. Pupils are equal, round, and reactive to light. No scleral icterus.  NECK: Normal range of motion, supple, no masses.  Normal thyroid.  SKIN: Skin is warm and dry. No rash noted. Not diaphoretic. No erythema. No pallor. NEUROLGIC: Alert and oriented to person, place, and time. Normal reflexes, muscle tone coordination. No cranial nerve deficit noted. PSYCHIATRIC: Normal mood and affect. Normal behavior. Normal judgment and thought content. CARDIOVASCULAR: Normal heart rate noted, regular rhythm RESPIRATORY: Clear to auscultation bilaterally. Effort and breath sounds normal, no problems with respiration noted. BREASTS: Symmetric in size. No masses, skin changes, nipple drainage, or lymphadenopathy. ABDOMEN: Soft, normal bowel sounds, no distention noted.  No tenderness, rebound or guarding.  MUSCULOSKELETAL: Normal range of motion. No tenderness.  No cyanosis, clubbing, or edema.  2+ distal pulses.   Assessment:  Annual gynecologic examination with pap smear   Plan:    Routine preventative health maintenance measures  emphasized. Please refer to After Visit Summary for other counseling recommendations.    Scheryl Darter, MD Attending Obstetrician & Gynecologist Center for [employer], [facility]

## 2023-04-07 NOTE — Telephone Encounter (Signed)
 LVM to advise pcp change

## 2023-04-10 LAB — POCT URINE PREGNANCY: Preg Test, Ur: NEGATIVE

## 2023-05-15 ENCOUNTER — Ambulatory Visit (INDEPENDENT_AMBULATORY_CARE_PROVIDER_SITE_OTHER): Payer: Self-pay | Admitting: Family Medicine

## 2023-05-15 ENCOUNTER — Other Ambulatory Visit (HOSPITAL_COMMUNITY): Payer: Self-pay

## 2023-05-15 ENCOUNTER — Encounter: Payer: Self-pay | Admitting: Family Medicine

## 2023-05-15 ENCOUNTER — Telehealth: Payer: Self-pay | Admitting: Pharmacy Technician

## 2023-05-15 VITALS — BP 128/89 | HR 83 | Wt 180.0 lb

## 2023-05-15 DIAGNOSIS — E66813 Obesity, class 3: Secondary | ICD-10-CM

## 2023-05-15 DIAGNOSIS — R7303 Prediabetes: Secondary | ICD-10-CM

## 2023-05-15 DIAGNOSIS — J302 Other seasonal allergic rhinitis: Secondary | ICD-10-CM

## 2023-05-15 DIAGNOSIS — Z23 Encounter for immunization: Secondary | ICD-10-CM

## 2023-05-15 DIAGNOSIS — E782 Mixed hyperlipidemia: Secondary | ICD-10-CM | POA: Diagnosis not present

## 2023-05-15 DIAGNOSIS — J4540 Moderate persistent asthma, uncomplicated: Secondary | ICD-10-CM

## 2023-05-15 DIAGNOSIS — Z7689 Persons encountering health services in other specified circumstances: Secondary | ICD-10-CM

## 2023-05-15 DIAGNOSIS — E559 Vitamin D deficiency, unspecified: Secondary | ICD-10-CM

## 2023-05-15 DIAGNOSIS — Z6841 Body Mass Index (BMI) 40.0 and over, adult: Secondary | ICD-10-CM

## 2023-05-15 MED ORDER — MONTELUKAST SODIUM 10 MG PO TABS: 10.0000 mg | ORAL_TABLET | Freq: Every day | ORAL | 1 refills | Status: DC

## 2023-05-15 MED ORDER — SEMAGLUTIDE-WEIGHT MANAGEMENT 0.25 MG/0.5ML ~~LOC~~ SOAJ: 0.2500 mg | SUBCUTANEOUS | 0 refills | Status: DC

## 2023-05-15 MED ORDER — ALBUTEROL SULFATE HFA 108 (90 BASE) MCG/ACT IN AERS: 2.0000 | INHALATION_SPRAY | Freq: Four times a day (QID) | RESPIRATORY_TRACT | 2 refills | Status: AC | PRN

## 2023-05-15 MED ORDER — SYMBICORT 160-4.5 MCG/ACT IN AERO: 2.0000 | INHALATION_SPRAY | Freq: Two times a day (BID) | RESPIRATORY_TRACT | 2 refills | Status: DC | PRN

## 2023-05-15 MED ORDER — LEVOCETIRIZINE DIHYDROCHLORIDE 5 MG PO TABS: 5.0000 mg | ORAL_TABLET | Freq: Every evening | ORAL | 3 refills | Status: DC

## 2023-05-15 NOTE — Progress Notes (Signed)
 Established Patient Office Visit   Subjective  Patient ID: Jo Martin, female    DOB: [birth year]  Age: 28 y.o. MRN: [id number]  Chief Complaint  Patient presents with   Weight Loss    Would like to discuss wegovy injections    Immunizations    Allergy shots, wants flu but it is not available at this time. Also wants pneumonia shot.     She  has a past medical history of Anxiety, Asthma, Depression, and History of open heart surgery.  Obesity: Patient complains of obesity. Patient cites health as reasons for wanting to lose weight.  Obesity History Weight in late teens: 125 lb. Period of greatest weight gain: 60 lb during early adult years Lowest adult weight: 125 Highest adult weight: 180 lb Amount of time at present weight: 180 lb.   History of Weight Loss Efforts Greatest amount of weight lost: 20 lb over 4 months Amount of time that loss was maintained: 12 months Circumstances associated with regain of weight: SSRI medication Successful weight loss techniques attempted: self-directed dieting Unsuccessful weight loss techniques attempted: very low calorie diet  Current Exercise Habits walking  Current Eating Habits Number of regular meals per day: 2 Number of snacking episodes per day: 2 Who shops for food? patient Who prepares food? patient Who eats with patient? patient and son Binge behavior?: no Purge behavior? no Anorexic behavior? no Eating precipitated by stress? no Guilt feelings associated with eating? no  Other Potential Contributing Factors Use of alcohol: average 0 drinks/week Use of medications that may cause weight gain SSRI History of past abuse? none Psych History: anxiety and obesity Comorbidities: dyslipidemias, prediabetes      Review of Systems  Constitutional:  Negative for chills and fever.  Respiratory:  Negative for shortness of breath.   Cardiovascular:  Negative for chest pain.  Gastrointestinal:  Negative for abdominal pain.   Genitourinary:  Negative for dysuria.  Neurological:  Negative for dizziness and headaches.      Objective:     BP 128/89   Pulse 83   Wt 180 lb 0.6 oz (81.7 kg)   LMP 05/03/2023 (Exact Date)   SpO2 93%   Breastfeeding No   BMI 41.85 kg/m  BP Readings from Last 3 Encounters:  05/15/23 128/89  03/18/23 126/80  02/28/23 108/73      Physical Exam Vitals reviewed.  Constitutional:      General: She is not in acute distress.    Appearance: Normal appearance. She is not ill-appearing, toxic-appearing or diaphoretic.  HENT:     Head: Normocephalic.  Eyes:     General:        Right eye: No discharge.        Left eye: No discharge.     Conjunctiva/sclera: Conjunctivae normal.  Cardiovascular:     Rate and Rhythm: Normal rate.     Pulses: Normal pulses.     Heart sounds: Normal heart sounds.  Pulmonary:     Effort: Pulmonary effort is normal. No respiratory distress.     Breath sounds: Normal breath sounds.  Abdominal:     General: Bowel sounds are normal.     Palpations: Abdomen is soft.     Tenderness: There is no abdominal tenderness. There is no right CVA tenderness, left CVA tenderness or guarding.  Skin:    General: Skin is warm and dry.     Capillary Refill: Capillary refill takes less than 2 seconds.  Neurological:     Mental Status:  She is alert.     Coordination: Coordination normal.     Gait: Gait normal.  Psychiatric:        Mood and Affect: Mood normal.        Behavior: Behavior normal.      No results found for any visits on 05/15/23.  The ASCVD Risk score (Arnett DK, et al., 2019) failed to calculate for the following reasons:   The 2019 ASCVD risk score is only valid for ages 18 to 70    Assessment & Plan:  Class 3 severe obesity due to excess calories with serious comorbidity and body mass index (BMI) of 40.0 to 44.9 in adult Jo Martin) Assessment & Plan: Co morbidity - hyperlipidemia, Asthma, anxiety and depression Trial on Wegovy 0.25 mg  weekly injections Discussed Eat a Balanced Diet: Focus on whole, nutrient-dense foods like lean proteins, vegetables, fruits, whole grains, and healthy fats while avoiding processed and sugary foods. Stay Active: Incorporate at least 30 minutes of moderate physical activity most days of the week, such as walking, jogging, or strength training. Hydrate and Rest: Drink plenty of water throughout the day and ensure you get 7-9 hours of quality sleep each night to support metabolism and recovery. Practice Portion Control: Use smaller plates, measure portions, and eat mindfully to avoid overeating and manage calorie intake effectively.   Orders: -     Semaglutide-Weight Management; Inject 0.25 mg into the skin once a week for 28 days.  Dispense: 2 mL; Refill: 0  Referral of patient -     Ambulatory referral to Allergy  Seasonal allergies -     Montelukast Sodium; Take 1 tablet (10 mg total) by mouth at bedtime.  Dispense: 90 tablet; Refill: 1  Moderate persistent asthma without complication -     Symbicort; Inhale 2 puffs into the lungs 2 (two) times daily as needed.  Dispense: 10.2 g; Refill: 2 -     Albuterol Sulfate HFA; Inhale 2 puffs into the lungs every 6 (six) hours as needed for wheezing or shortness of breath.  Dispense: 8 g; Refill: 2  Mixed hyperlipidemia -     Lipid panel -     CMP14+EGFR -     CBC with Differential/Platelet  Prediabetes -     Hemoglobin A1c  Vitamin D deficiency -     VITAMIN D 25 Hydroxy (Vit-D Deficiency, Fractures)  Other orders -     Levocetirizine Dihydrochloride; Take 1 tablet (5 mg total) by mouth every evening.  Dispense: 30 tablet; Refill: 3    Return in about 4 months (around 09/14/2023), or if symptoms worsen or fail to improve, for pre-diabetes, Weight Loss Mangment.   Avelino Lek Amber Bail, FNP

## 2023-05-15 NOTE — Assessment & Plan Note (Addendum)
 Co morbidity - hyperlipidemia, Asthma, anxiety and depression Trial on Wegovy 0.25 mg weekly injections Discussed Eat a Balanced Diet: Focus on whole, nutrient-dense foods like lean proteins, vegetables, fruits, whole grains, and healthy fats while avoiding processed and sugary foods. Stay Active: Incorporate at least 30 minutes of moderate physical activity most days of the week, such as walking, jogging, or strength training. Hydrate and Rest: Drink plenty of water throughout the day and ensure you get 7-9 hours of quality sleep each night to support metabolism and recovery. Practice Portion Control: Use smaller plates, measure portions, and eat mindfully to avoid overeating and manage calorie intake effectively.

## 2023-05-15 NOTE — Patient Instructions (Addendum)
        Great to see you today.  I have refilled the medication(s) we provide.   If labs were collected, we will inform you of lab results once received either by echart message or telephone call.   - echart message- for normal results that have been seen by the patient already.   - telephone call: abnormal results or if patient has not viewed results in their echart.   - Please take medications as prescribed. - Follow up with your primary health provider if any health concerns arises. - If symptoms worsen please contact your primary care provider and/or visit the emergency department.       Wegovy (Semaglutide) Titration Schedule Month 1: 0.25 mg once a week. Month 2: 0.5 mg once a week. Month 3: 1 mg once a week. Month 4: 1.7 mg once a week. Month 5 and Beyond: 2.4 mg once a week (maintenance dose).  Common Side Effects of Wegovy Gastrointestinal (most common):Nausea, Vomiting, Diarrhea. Constipation. Stomach pain  OTC Medications for Wegovy Side Effects Nausea: Ginger supplements or Dramamine (dimenhydrinate). Diarrhea: Imodium (loperamide). Constipation: Colace (docusate) or fiber supplements like psyllium. Stomach Pain/Indigestion: Tums or Pepcid (famotidine). Headache: Tylenol (acetaminophen) or Advil (ibuprofen).

## 2023-05-15 NOTE — Telephone Encounter (Signed)
 Pharmacy Patient Advocate Encounter   Received notification from CoverMyMeds that prior authorization for [facility] 0.25MG /0.5ML auto-injectors is required/requested.   Insurance verification completed.   The patient is insured through [facility] [place] .   Per test claim: PA required; PA submitted to above mentioned insurance via CoverMyMeds Key/confirmation #/EOC [id number] Status is pending

## 2023-05-16 ENCOUNTER — Encounter: Payer: Self-pay | Admitting: Family Medicine

## 2023-05-16 ENCOUNTER — Other Ambulatory Visit (HOSPITAL_COMMUNITY): Payer: Self-pay

## 2023-05-16 LAB — CMP14+EGFR
ALT: 17 IU/L (ref 0–32)
AST: 17 IU/L (ref 0–40)
Albumin: 4.8 g/dL (ref 4.0–5.0)
Alkaline Phosphatase: 71 IU/L (ref 44–121)
BUN/Creatinine Ratio: 20 (ref 9–23)
BUN: 12 mg/dL (ref 6–20)
Bilirubin Total: <0.2 mg/dL (ref 0.0–1.2)
CO2: 23 mmol/L (ref 20–29)
Calcium: 9.3 mg/dL (ref 8.7–10.2)
Chloride: 104 mmol/L (ref 96–106)
Creatinine, Ser: 0.61 mg/dL (ref 0.57–1.00)
Globulin, Total: 2.4 g/dL (ref 1.5–4.5)
Glucose: 89 mg/dL (ref 70–99)
Potassium: 4.3 mmol/L (ref 3.5–5.2)
Sodium: 142 mmol/L (ref 134–144)
Total Protein: 7.2 g/dL (ref 6.0–8.5)
eGFR: 126 mL/min/{1.73_m2} (ref >59)

## 2023-05-16 LAB — CBC WITH DIFFERENTIAL/PLATELET
Basophils Absolute: 0 10*3/uL (ref 0.0–0.2)
Basos: 0 %
EOS (ABSOLUTE): 0.2 10*3/uL (ref 0.0–0.4)
Eos: 3 %
Hematocrit: 46 % (ref 34.0–46.6)
Hemoglobin: 14.7 g/dL (ref 11.1–15.9)
Immature Grans (Abs): 0 10*3/uL (ref 0.0–0.1)
Immature Granulocytes: 0 %
Lymphocytes Absolute: 2.2 10*3/uL (ref 0.7–3.1)
Lymphs: 31 %
MCH: 26.5 pg — ABNORMAL LOW (ref 26.6–33.0)
MCHC: 32 g/dL (ref 31.5–35.7)
MCV: 83 fL (ref 79–97)
Monocytes Absolute: 0.4 10*3/uL (ref 0.1–0.9)
Monocytes: 5 %
Neutrophils Absolute: 4.3 10*3/uL (ref 1.4–7.0)
Neutrophils: 61 %
Platelets: 255 10*3/uL (ref 150–450)
RBC: 5.55 x10E6/uL — ABNORMAL HIGH (ref 3.77–5.28)
RDW: 13.9 % (ref 11.7–15.4)
WBC: 7.1 10*3/uL (ref 3.4–10.8)

## 2023-05-16 LAB — LIPID PANEL
Chol/HDL Ratio: 5.5 ratio — ABNORMAL HIGH (ref 0.0–4.4)
Cholesterol, Total: 172 mg/dL (ref 100–199)
HDL: 31 mg/dL — ABNORMAL LOW (ref >39)
LDL Chol Calc (NIH): 109 mg/dL — ABNORMAL HIGH (ref 0–99)
Triglycerides: 180 mg/dL — ABNORMAL HIGH (ref 0–149)
VLDL Cholesterol Cal: 32 mg/dL (ref 5–40)

## 2023-05-16 LAB — VITAMIN D 25 HYDROXY (VIT D DEFICIENCY, FRACTURES): Vit D, 25-Hydroxy: 16 ng/mL — ABNORMAL LOW (ref 30.0–100.0)

## 2023-05-16 LAB — HEMOGLOBIN A1C
Est. average glucose Bld gHb Est-mCnc: 120 mg/dL
Hgb A1c MFr Bld: 5.8 % — ABNORMAL HIGH (ref 4.8–5.6)

## 2023-05-16 NOTE — Telephone Encounter (Signed)
 Pharmacy Patient Advocate Encounter  Received notification from [facility] Medicaid that Prior Authorization for Wegovy  0.25MG /0.5ML auto-injectors has been APPROVED from 05/15/2023 to 11/11/2023. Unable to obtain price due to refill too soon rejection, last fill date 05/15/2023 next available fill date05/07/2023.   PA #/Case ID/Reference #: [id number]

## 2023-06-12 ENCOUNTER — Other Ambulatory Visit: Payer: Self-pay | Admitting: Family Medicine

## 2023-06-12 DIAGNOSIS — Z6841 Body Mass Index (BMI) 40.0 and over, adult: Secondary | ICD-10-CM

## 2023-06-12 MED ORDER — SEMAGLUTIDE-WEIGHT MANAGEMENT 0.25 MG/0.5ML ~~LOC~~ SOAJ: 0.2500 mg | SUBCUTANEOUS | 0 refills | Status: AC

## 2023-06-12 NOTE — Telephone Encounter (Signed)
 Copied from CRM [id number]. Topic: Clinical - Medication Refill >> Jun 12, 2023 12:09 PM Oddis Bench wrote: Medication: Semaglutide -Weight Management 0.25 MG/0.5ML SOAJ  Has the patient contacted their pharmacy? Yes To reach out to pcp  This is the patient's preferred pharmacy:  [id number] - [place], [place] - [address] [place] [address] Phone: [phone] Fax: [phone]  Is this the correct pharmacy for this prescription? Yes If no, delete pharmacy and type the correct one.   Has the prescription been filled recently? Yes  Is the patient out of the medication? No  Has the patient been seen for an appointment in the last year OR does the patient have an upcoming appointment? Yes  Can we respond through MyChart? Yes  Agent: Please be advised that Rx refills may take up to 3 business days. We ask that you follow-up with your pharmacy.

## 2023-07-03 ENCOUNTER — Ambulatory Visit: Payer: Self-pay | Admitting: Family Medicine

## 2023-07-11 ENCOUNTER — Telehealth: Payer: Self-pay | Admitting: Family Medicine

## 2023-07-11 NOTE — Telephone Encounter (Signed)
 Copied from CRM [id number]. Topic: Clinical - Medication Refill >> Jul 11, 2023  1:12 PM Rosaria Common wrote: Medication: Wegovy (not on list)  Has the patient contacted their pharmacy? Yes (Agent: If no, request that the patient contact the pharmacy for the refill. If patient does not wish to contact the pharmacy document the reason why and proceed with request.) (Agent: If yes, when and what did the pharmacy advise?)  This is the patient's preferred pharmacy:  [id number] - [place], [place] - [address] [place] [address] Phone: [phone] Fax: [phone]  Is this the correct pharmacy for this prescription? Yes If no, delete pharmacy and type the correct one.   Has the prescription been filled recently? Yes  Is the patient out of the medication? No  Has the patient been seen for an appointment in the last year OR does the patient have an upcoming appointment? Yes  Can we respond through MyChart? Yes  Agent: Please be advised that Rx refills may take up to 3 business days. We ask that you follow-up with your pharmacy.

## 2023-07-11 NOTE — Telephone Encounter (Signed)
 Requesting Wegovy , not on current med list to pend

## 2023-07-17 ENCOUNTER — Telehealth: Payer: Self-pay

## 2023-07-17 NOTE — Telephone Encounter (Signed)
 Copied from CRM [id number]. Topic: Clinical - Prescription Issue >> Jul 17, 2023 12:24 PM Carlatta H wrote: Reason for CRM: Patient was previous prescribed Wegovy  by FNP Polanco and is requesting a refill and dosage to be moved up//The prescription was not filled previously stating not on med list//Please also call patient to advise

## 2023-07-18 NOTE — Telephone Encounter (Signed)
 Scheduled

## 2023-07-31 ENCOUNTER — Encounter: Payer: Self-pay | Admitting: Family Medicine

## 2023-07-31 ENCOUNTER — Telehealth: Payer: Self-pay | Admitting: Pharmacy Technician

## 2023-07-31 ENCOUNTER — Ambulatory Visit: Admitting: Family Medicine

## 2023-07-31 ENCOUNTER — Other Ambulatory Visit (HOSPITAL_COMMUNITY): Payer: Self-pay

## 2023-07-31 VITALS — BP 131/89 | HR 96 | Ht <= 58 in | Wt 185.0 lb

## 2023-07-31 DIAGNOSIS — J069 Acute upper respiratory infection, unspecified: Secondary | ICD-10-CM | POA: Diagnosis not present

## 2023-07-31 DIAGNOSIS — Z6841 Body Mass Index (BMI) 40.0 and over, adult: Secondary | ICD-10-CM | POA: Diagnosis not present

## 2023-07-31 DIAGNOSIS — E66813 Obesity, class 3: Secondary | ICD-10-CM | POA: Diagnosis not present

## 2023-07-31 DIAGNOSIS — J988 Other specified respiratory disorders: Secondary | ICD-10-CM

## 2023-07-31 MED ORDER — DOXYCYCLINE HYCLATE 100 MG PO TABS: 100.0000 mg | ORAL_TABLET | Freq: Two times a day (BID) | ORAL | 0 refills | Status: AC

## 2023-07-31 MED ORDER — WEGOVY 0.5 MG/0.5ML ~~LOC~~ SOAJ: 0.5000 mg | SUBCUTANEOUS | 0 refills | Status: AC

## 2023-07-31 MED ORDER — IPRATROPIUM BROMIDE 0.03 % NA SOLN
2.0000 | Freq: Two times a day (BID) | NASAL | 12 refills | Status: AC
Start: 2023-07-31 — End: ?

## 2023-07-31 MED ORDER — SEMAGLUTIDE-WEIGHT MANAGEMENT 0.25 MG/0.5ML ~~LOC~~ SOAJ
0.2500 mg | SUBCUTANEOUS | 0 refills | Status: DC
Start: 2023-07-31 — End: 2023-07-31

## 2023-07-31 MED ORDER — PROMETHAZINE-DM 6.25-15 MG/5ML PO SYRP: 5.0000 mL | ORAL_SOLUTION | Freq: Three times a day (TID) | ORAL | 0 refills | Status: AC | PRN

## 2023-07-31 NOTE — Patient Instructions (Signed)
 Wegovy (Semaglutide) Titration Schedule Month 1: 0.25 mg once a week. Month 2: 0.5 mg once a week. Month 3: 1 mg once a week. Month 4: 1.7 mg once a week. Month 5 and Beyond: 2.4 mg once a week (maintenance dose).  Common Side Effects of Wegovy Gastrointestinal (most common):Nausea, Vomiting, Diarrhea. Constipation. Stomach pain  OTC Medications for Wegovy Side Effects Nausea: Ginger supplements or Dramamine (dimenhydrinate). Diarrhea: Imodium (loperamide). Constipation: Colace (docusate) or fiber supplements like psyllium. Stomach Pain/Indigestion: Tums or Pepcid (famotidine). Headache: Tylenol (acetaminophen) or Advil (ibuprofen).

## 2023-07-31 NOTE — Assessment & Plan Note (Signed)
 Doxycyline 100 mg twice daily x 7 days  Promethazine  syrup, ipratropium (ATROVENT) 0.03 % nasal spray 2 times daily Advise patient to rest to support your body's recovery. Stay hydrated by drinking water, tea, or broth. Using a humidifier can help soothe throat irritation and ease nasal congestion. For fever or pain, acetaminophen  (Tylenol ) is recommended. To relieve other symptoms, try saline nasal sprays, throat lozenges, or gargling with saltwater. Focus on eating light, healthy meals like fruits and vegetables to keep your strength up. Practice good hygiene by washing your hands frequently and covering your mouth when coughing or sneezing.Follow-up for worsening or persistent symptoms. Patient verbalizes understanding regarding plan of care and all questions answered

## 2023-07-31 NOTE — Addendum Note (Signed)
 Addended by: TERRY WILHELMENA LLOYD HILARIO on: 07/31/2023 08:41 AM   Modules accepted: Orders

## 2023-07-31 NOTE — Addendum Note (Signed)
 Addended by: TERRY WILHELMENA LLOYD HILARIO on: 07/31/2023 08:54 AM   Modules accepted: Orders

## 2023-07-31 NOTE — Telephone Encounter (Signed)
 Pharmacy Patient Advocate Encounter   Received notification from CoverMyMeds that prior authorization for Wegovy  0.25MG /0.5ML auto-injectors is required/requested.   Insurance verification completed.   The patient is insured through [facility] [place] .   Per test claim: Patient prescribed 0.5mg  at today's visit. Refill too soon. 0.5mg  last filled 07/31/2023 and next refill payable on or after 08/20/2023.

## 2023-07-31 NOTE — Assessment & Plan Note (Signed)
 Due for Increase dosage sent Weovgy 0.5 weekly injection Continued discussion adhering to weight loss plan, with strong emphasize on nutrition and exercise. Read food labels to know how many calories are in each serving , Increase water intake 2-3 L a day. Include more protein intake such as lean meat, poultry, fish. Increase fiber intake such as vegetables, whole grains, fruits, artichokes, green peas, broccoli, lentils and lima beans.

## 2023-07-31 NOTE — Progress Notes (Signed)
 Established Patient Office Visit   Subjective  Patient ID: Jo Martin, female    DOB: 14-Apr-1995  Age: 28 y.o. MRN: [id number]  Chief Complaint  Patient presents with   Cough    Coughing with congestion for four days   Obesity    Follow up, would like weight loss shots    She  has a past medical history of Anxiety, Asthma, Depression, and History of open heart surgery.  Patient complains of evaluation of cough, evaluation of fever. Patient describes symptoms of cough, fatigue, headache, malaise, sputum production, and wheezing. Symptoms began several days ago and are gradually worsening since that time. Patient denies chest pain. Treatment thus far includes OTC analgesics/antipyretics: not very effective and anti-tussive: not very effective Past pulmonary history is significant for frequent episodes of bronchitis and pneumonia     Review of Systems  Constitutional:  Positive for fever and malaise/fatigue.  Respiratory:  Positive for cough and wheezing. Negative for shortness of breath.   Cardiovascular:  Negative for chest pain.  Gastrointestinal:  Negative for abdominal pain.  Genitourinary:  Negative for dysuria.  Neurological:  Negative for dizziness and headaches.      Objective:     BP 131/89   Pulse 96   Ht 4' 7 (1.397 m)   Wt 185 lb (83.9 kg)   SpO2 96%   BMI 43.00 kg/m  BP Readings from Last 3 Encounters:  07/31/23 131/89  05/15/23 128/89  03/18/23 126/80      Physical Exam Vitals reviewed.  Constitutional:      General: She is not in acute distress.    Appearance: Normal appearance. She is not ill-appearing, toxic-appearing or diaphoretic.  HENT:     Head: Normocephalic.  Eyes:     General:        Right eye: No discharge.        Left eye: No discharge.     Conjunctiva/sclera: Conjunctivae normal.  Cardiovascular:     Rate and Rhythm: Normal rate.     Pulses: Normal pulses.     Heart sounds: Normal heart sounds.  Pulmonary:     Effort:  Pulmonary effort is normal. No respiratory distress.     Breath sounds: Wheezing present.  Skin:    General: Skin is warm and dry.  Neurological:     Mental Status: She is alert.     Coordination: Coordination normal.     Gait: Gait normal.  Psychiatric:        Mood and Affect: Mood normal.        Behavior: Behavior normal.      No results found for any visits on 07/31/23.  The ASCVD Risk score (Arnett DK, et al., 2019) failed to calculate for the following reasons:   The 2019 ASCVD risk score is only valid for ages 65 to 46    Assessment & Plan:  Class 3 severe obesity with serious comorbidity and body mass index (BMI) of 40.0 to 44.9 in adult -     Wegovy ; Inject 0.5 mg into the skin once a week.  Dispense: 2 mL; Refill: 0  Respiratory infection -     Doxycycline  Hyclate; Take 1 tablet (100 mg total) by mouth 2 (two) times daily for 7 days.  Dispense: 14 tablet; Refill: 0 -     Promethazine -DM; Take 5 mLs by mouth 3 (three) times daily as needed.  Dispense: 118 mL; Refill: 0 -     Ipratropium Bromide; Place 2 sprays into both  nostrils every 12 (twelve) hours.  Dispense: 30 mL; Refill: 12  Upper respiratory tract infection, unspecified type Assessment & Plan: Doxycyline 100 mg twice daily x 7 days  Promethazine  syrup, ipratropium (ATROVENT) 0.03 % nasal spray 2 times daily Advise patient to rest to support your body's recovery. Stay hydrated by drinking water, tea, or broth. Using a humidifier can help soothe throat irritation and ease nasal congestion. For fever or pain, acetaminophen  (Tylenol ) is recommended. To relieve other symptoms, try saline nasal sprays, throat lozenges, or gargling with saltwater. Focus on eating light, healthy meals like fruits and vegetables to keep your strength up. Practice good hygiene by washing your hands frequently and covering your mouth when coughing or sneezing.Follow-up for worsening or persistent symptoms. Patient verbalizes understanding  regarding plan of care and all questions answered        Class 3 severe obesity due to excess calories with body mass index (BMI) of 40.0 to 44.9 in adult Assessment & Plan: Due for Increase dosage sent Weovgy 0.5 weekly injection Continued discussion adhering to weight loss plan, with strong emphasize on nutrition and exercise. Read food labels to know how many calories are in each serving , Increase water intake 2-3 L a day. Include more protein intake such as lean meat, poultry, fish. Increase fiber intake such as vegetables, whole grains, fruits, artichokes, green peas, broccoli, lentils and lima beans.      Return in about 3 months (around 10/31/2023), or if symptoms worsen or fail to improve, for routine labs, hyperlipidemia, Weight Loss Mangment.   Hilario Kidd Wilhelmena Falter, FNP

## 2023-08-06 ENCOUNTER — Ambulatory Visit: Payer: Self-pay | Admitting: Allergy & Immunology

## 2023-09-05 ENCOUNTER — Ambulatory Visit: Admitting: Family Medicine

## 2023-09-12 ENCOUNTER — Ambulatory Visit: Payer: Self-pay | Admitting: Allergy & Immunology

## 2023-09-18 ENCOUNTER — Ambulatory Visit: Admitting: Family Medicine

## 2023-09-23 ENCOUNTER — Telehealth: Payer: Self-pay | Admitting: Family Medicine

## 2023-09-23 NOTE — Telephone Encounter (Signed)
 Copied from CRM [id number]. Topic: General - Billing Inquiry >> Sep 23, 2023 12:54 PM Jo Martin wrote: Pt calling to ask about why she was billed for her last visit July 3rd. Billing informed her it was because the office did not pre authorize it.  Please give the pt a call to help her understand how this happened

## 2023-10-01 ENCOUNTER — Telehealth: Payer: Self-pay

## 2023-10-01 NOTE — Telephone Encounter (Signed)
 Copied from CRM #[id number]. Topic: General - Other >> Oct 01, 2023  2:10 PM Marissa P wrote: Reason for CRM: Pt calling to ask about why she was billed for her last visit July 3rd. Billing informed her it was because the office did not pre authorize it.  Please give the pt a call to help her understand how this happened, Patient ha been waiting since 09/23/2023

## 2023-10-02 NOTE — Telephone Encounter (Signed)
**Note De-identified  [place] Obfuscation** Please advise 

## 2023-10-09 NOTE — Telephone Encounter (Signed)
 I have looked and its just an established care visit and the charges look correct. It was billed [id number]. I am not sure what would have needed to be preauthorized. I see she was sent in Wegovy  at that visit that needed a PA but that wouldn't have been associated with the ov.

## 2023-11-06 ENCOUNTER — Ambulatory Visit: Admitting: Family Medicine

## 2023-11-13 ENCOUNTER — Other Ambulatory Visit (HOSPITAL_COMMUNITY): Payer: Self-pay

## 2023-11-13 ENCOUNTER — Telehealth: Payer: Self-pay | Admitting: Pharmacy Technician

## 2023-11-13 NOTE — Telephone Encounter (Signed)
 Pharmacy Patient Advocate Encounter   Received notification from CoverMyMeds that prior authorization for Wegovy  0.25MG /0.5ML auto-injectors is required/requested.   Insurance verification completed.   The patient is insured through [facility] MEDICAID.   Per test claim: Effective October 1st, Medicaid will discontinue coverage of GLP1 medications for weight loss (such as Wegovy  and Zepbound), unless the patient has a documented history of a heart attack or stroke. Zepbound will continue to be covered only for patients with moderate to severe sleep apnea (AHI 15-30) and a BMI greater than 40. Because of this change, the prior authorization team will not be submitting new PA requests for GLP1 medications prescribed for weight loss, as patients will be unable to continue therapy under Medicaid coverage.

## 2023-11-13 NOTE — Telephone Encounter (Signed)
 I'm going to close this encounter and go ahead and submit the PA renewal due to her aortic stenosis diagnosis in 2016 and obesity

## 2023-11-13 NOTE — Telephone Encounter (Signed)
 Pharmacy Patient Advocate Encounter   Received notification from Onbase that prior authorization for Wegovy  0.25MG /0.5ML auto-injectors  is required/requested.   Insurance verification completed.   The patient is insured through [facility] MEDICAID.   Per test claim: Insurance companies are becoming increasingly stricter about requiring thorough documentation of lifestyle modifications in the patient's chart at each visit. This includes detailed records of diet recommendations (caloric intake, etc), exercise plans (amount of time/wk, etc), and an emphasis on the patient's commitment to continuing these efforts while on medication.  Without this additional documentation in the chart notes, a prior authorization will most likely be denied.  Our PA team also need a documented weight and BMI taken within the last 45 days so that we can proceed with submitting the PA please.  Thanks!

## 2023-11-17 ENCOUNTER — Other Ambulatory Visit (HOSPITAL_COMMUNITY): Payer: Self-pay

## 2023-11-17 NOTE — Telephone Encounter (Signed)
 Called patient left voicemail to call office to schedule an in office visit.

## 2023-12-31 ENCOUNTER — Ambulatory Visit: Payer: Self-pay | Admitting: Allergy & Immunology

## 2024-01-28 ENCOUNTER — Encounter: Payer: Self-pay | Admitting: Internal Medicine

## 2024-01-28 ENCOUNTER — Other Ambulatory Visit: Payer: Self-pay

## 2024-01-28 ENCOUNTER — Ambulatory Visit: Payer: Self-pay | Admitting: Internal Medicine

## 2024-01-28 VITALS — BP 128/76 | HR 100 | Temp 98.3°F | Ht <= 58 in

## 2024-01-28 DIAGNOSIS — J302 Other seasonal allergic rhinitis: Secondary | ICD-10-CM | POA: Diagnosis not present

## 2024-01-28 DIAGNOSIS — J3089 Other allergic rhinitis: Secondary | ICD-10-CM

## 2024-01-28 DIAGNOSIS — J4540 Moderate persistent asthma, uncomplicated: Secondary | ICD-10-CM

## 2024-01-28 MED ORDER — SYMBICORT 160-4.5 MCG/ACT IN AERO: 1.0000 | INHALATION_SPRAY | Freq: Two times a day (BID) | RESPIRATORY_TRACT | 5 refills | Status: AC

## 2024-01-28 MED ORDER — ALBUTEROL SULFATE (2.5 MG/3ML) 0.083% IN NEBU: 2.5000 mg | INHALATION_SOLUTION | Freq: Four times a day (QID) | RESPIRATORY_TRACT | 1 refills | Status: AC | PRN

## 2024-01-28 MED ORDER — LEVOCETIRIZINE DIHYDROCHLORIDE 5 MG PO TABS: 5.0000 mg | ORAL_TABLET | Freq: Every evening | ORAL | 1 refills | Status: AC

## 2024-01-28 MED ORDER — ALBUTEROL SULFATE HFA 108 (90 BASE) MCG/ACT IN AERS: 2.0000 | INHALATION_SPRAY | Freq: Four times a day (QID) | RESPIRATORY_TRACT | 1 refills | Status: AC | PRN

## 2024-01-28 MED ORDER — MONTELUKAST SODIUM 10 MG PO TABS: 10.0000 mg | ORAL_TABLET | Freq: Every day | ORAL | 1 refills | Status: AC

## 2024-01-28 NOTE — Progress Notes (Signed)
 "  NEW PATIENT  Date of Service/Encounter:  01/28/2024  Consult requested by: Terry Wilhelmena Lloyd Hilario, FNP   Subjective:   Jo Martin (DOB: 16-Apr-1995) is a 28 y.o. female who presents to the clinic on 01/28/2024 with a chief complaint of Asthma, Seasonal Allergy, and Establish Care .    History obtained from: chart review and patient.   Asthma:  Diagnosed in childhood.  Notes having trouble with dyspnea with weather changes, activity, anxiety. She requires Symbicort  or Albuterol  about 2-3x/week due to this.  She does not use the Symbicort  BID as prescribed, just as PRN.   She would like a new nebulizer machine as that works best for rescue; last one was over 60-68 years old and she no longer has it.  Using rescue inhaler: 2-3x/week  Limitations to daily activity: some 0 ED visits/UC visits and 1 oral steroids in the past year 0 number of lifetime hospitalizations, 0 number of lifetime intubations.  Identified Triggers: weather change, anxiety and exercise Prior PFTs or spirometry: none Current regimen:  Maintenance:  Rescue: Albuterol  2 puffs or Symbicort  2 puffs PRN   Rhinitis:  Started since childhood.  Symptoms include: nasal congestion, rhinorrhea, post nasal drainage, and sneezing  Occurs seasonally-Spring/Winter  Potential triggers: not sure  Treatments tried:  Zyrtec or Xyzal , Singulair   Gags with nose sprays   Previous allergy testing: no History of sinus surgery: no Nonallergic triggers: none    Reviewed:  05/15/2023: seen by PCP with asthma, allergic rhinitis, obesity.  On Singulair , Symbicort . Referred to Allergy.  02/19/2023: seen in ED for cough, congestion.  No wheezing on exam.  Given Albuterol  due to diminished air movement.  D/c home on oral prednisone .    Past Medical History: Past Medical History:  Diagnosis Date   Anxiety    Asthma    Depression    History of open heart surgery    Past Surgical History: Past Surgical History:  Procedure  Laterality Date   CESAREAN SECTION     cochlear implants x2     open heart surgery     TONSILLECTOMY     TYMPANOSTOMY TUBE PLACEMENT      Family History: Family History  Problem Relation Age of Onset   Asthma Mother    Asthma Father    Cancer Paternal Aunt     Social History:  Flooring in bedroom: laminate Pets: cat and dog Tobacco use/exposure: none Job: disabled   Medication List:  Allergies as of 01/28/2024       Reactions   Amoxicillin-pot Clavulanate Rash, Shortness Of Breath   Penicillins Shortness Of Breath   Sulfa  Antibiotics Shortness Of Breath, Anaphylaxis   Sulfasalazine Shortness Of Breath        Medication List        Accurate as of January 28, 2024  3:14 PM. If you have any questions, ask your nurse or doctor.          acetaminophen  500 MG tablet Commonly known as: TYLENOL  Take 1,000 mg by mouth every 6 (six) hours as needed for moderate pain.   albuterol  108 (90 Base) MCG/ACT inhaler Commonly known as: VENTOLIN  HFA Inhale 2 puffs into the lungs every 6 (six) hours as needed for wheezing or shortness of breath.   azithromycin  250 MG tablet Commonly known as: ZITHROMAX  Take 2 tablets on day 1, then 1 tablet daily on days 2 through 5   cyclobenzaprine  5 MG tablet Commonly known as: FLEXERIL  Take 1 tablet (5 mg total) by  mouth 3 (three) times daily as needed for muscle spasms.   escitalopram  10 MG tablet Commonly known as: Lexapro  Take 1 tablet (10 mg total) by mouth daily.   hydrOXYzine  25 MG capsule Commonly known as: VISTARIL  Take 1 capsule (25 mg total) by mouth every 8 (eight) hours as needed for anxiety.   ipratropium 0.03 % nasal spray Commonly known as: ATROVENT  Place 2 sprays into both nostrils every 12 (twelve) hours.   levocetirizine 5 MG tablet Commonly known as: XYZAL  Take 1 tablet (5 mg total) by mouth every evening.   montelukast  10 MG tablet Commonly known as: SINGULAIR  Take 1 tablet (10 mg total) by mouth at  bedtime.   promethazine -dextromethorphan 6.25-15 MG/5ML syrup Commonly known as: PROMETHAZINE -DM Take 5 mLs by mouth 3 (three) times daily as needed.   rosuvastatin  10 MG tablet Commonly known as: Crestor  Take 1 tablet (10 mg total) by mouth daily.   Symbicort  160-4.5 MCG/ACT inhaler Generic drug: budesonide-formoterol Inhale 2 puffs into the lungs 2 (two) times daily as needed.   Vitamin D  (Ergocalciferol ) 1.25 MG (50000 UNIT) Caps capsule Commonly known as: DRISDOL  Take 1 capsule (50,000 Units total) by mouth every 7 (seven) days.   Wegovy  0.5 MG/0.5ML Soaj SQ injection Generic drug: semaglutide -weight management Inject 0.5 mg into the skin once a week.         REVIEW OF SYSTEMS: Pertinent positives and negatives discussed in HPI.   Objective:   Physical Exam: BP 128/76 (BP Location: Right Arm, Patient Position: Sitting, Cuff Size: Normal)   Pulse 100   Temp 98.3 F (36.8 C) (Temporal)   Ht 4' 7.12 (1.4 m)   SpO2 97%   BMI 42.81 kg/m  Body mass index is 42.81 kg/m. GEN: alert, well developed HEENT: clear conjunctiva, nose with + mild inferior turbinate hypertrophy, pink nasal mucosa, + clear rhinorrhea, + cobblestoning HEART: regular rate and rhythm, no murmur LUNGS: clear to auscultation bilaterally, no coughing, unlabored respiration ABDOMEN: soft, non distended  SKIN: no rashes or lesions  Spirometry:  Tracings reviewed. Her effort: It was hard to get consistent efforts and there is a question as to whether this reflects a maximal maneuver. FVC: 1.25L, 46% predicted FEV1: 0.9L, 38% predicted FEV1/FVC ratio: 72% Interpretation: Spirometry consistent with mixed obstructive and restrictive disease.  Please see scanned spirometry results for details.   Assessment:   1. Other allergic rhinitis   2. Moderate persistent asthma without complication     Plan/Recommendations:  Moderate Persistent Asthma: - Spirometry today with poor effort, possible  restriction + obstruction. Discussed starting Symbicort  BID, not PRN.   - Maintenance inhaler: start Symbicort  160-4.70mcg 1 puff twice daily.   - Rescue inhaler: Albuterol  2 puffs via spacer or 1 vial via nebulizer every 4-6 hours as needed for respiratory symptoms of cough, shortness of breath, or wheezing Asthma control goals:  Full participation in all desired activities (may need albuterol  before activity) Albuterol  use two times or less a week on average (not counting use with activity) Cough interfering with sleep two times or less a month Oral steroids no more than once a year No hospitalizations  Other Allergic Rhinitis: - Due to turbinate hypertrophy, seasonal symptoms, asthma and unresponsive to over the counter meds, will perform skin testing to identify aeroallergen triggers.   - Use nasal saline rinses before nose sprays such as with Neilmed Sinus Rinse.  Use distilled water.   - Use Zyrtec 10 mg or Xyzal  5mg  daily.  - Use Singulair  10mg  daily.  Stop if there are any mood/behavioral changes.   Follow up: 1/5 at 9 AM for skin testing 1-55, no IDs  Hold all anti-histamines (Xyzal , Allegra, Zyrtec, Claritin, Benadryl, Pepcid) 3 days prior to next visit.    Arleta Blanch, MD [facility]        "

## 2024-01-28 NOTE — Addendum Note (Signed)
 Addended by: AZALEA, Demorris Choyce on: 01/28/2024 04:56 PM   Modules accepted: Orders

## 2024-01-28 NOTE — Addendum Note (Signed)
 Addended by: AZALEA, Adrean Heitz on: 01/28/2024 03:23 PM   Modules accepted: Orders

## 2024-01-28 NOTE — Patient Instructions (Addendum)
 Moderate Persistent Asthma: - Maintenance inhaler: start Symbicort  160-4.59mcg 1 puff twice daily.   - Rescue inhaler: Albuterol  2 puffs via spacer or 1 vial via nebulizer every 4-6 hours as needed for respiratory symptoms of cough, shortness of breath, or wheezing Asthma control goals:  Full participation in all desired activities (may need albuterol  before activity) Albuterol  use two times or less a week on average (not counting use with activity) Cough interfering with sleep two times or less a month Oral steroids no more than once a year No hospitalizations  Other Allergic Rhinitis: - Use nasal saline rinses before nose sprays such as with Neilmed Sinus Rinse.  Use distilled water.   - Use Zyrtec 10 mg or Xyzal  5mg  daily.  - Use Singulair  10mg  daily.  Stop if there are any mood/behavioral changes.   Follow up: 1/5 at 9 AM for skin testing 1-55 Hold all anti-histamines (Xyzal , Allegra, Zyrtec, Claritin, Benadryl, Pepcid) 3 days prior to next visit.

## 2024-02-02 ENCOUNTER — Ambulatory Visit: Admitting: Internal Medicine

## 2024-02-02 DIAGNOSIS — J4540 Moderate persistent asthma, uncomplicated: Secondary | ICD-10-CM

## 2024-02-02 DIAGNOSIS — J3081 Allergic rhinitis due to animal (cat) (dog) hair and dander: Secondary | ICD-10-CM

## 2024-02-02 DIAGNOSIS — J3089 Other allergic rhinitis: Secondary | ICD-10-CM

## 2024-02-02 DIAGNOSIS — J301 Allergic rhinitis due to pollen: Secondary | ICD-10-CM | POA: Diagnosis not present

## 2024-02-02 MED ORDER — AZELASTINE HCL 0.1 % NA SOLN: 2.0000 | Freq: Two times a day (BID) | NASAL | 5 refills | Status: AC | PRN

## 2024-02-02 NOTE — Progress Notes (Signed)
 "  FOLLOW UP Date of Service/Encounter:  02/02/2024   Subjective:  Jo Martin (DOB: 07-Nov-1995) is a 29 y.o. female who [facility] for follow up for skin testing.   History obtained from: chart review and patient.  Anti histamines held.   Past Medical History: Past Medical History:  Diagnosis Date   Anxiety    Asthma    Depression    History of open heart surgery     Objective:  There were no vitals taken for this visit. There is no height or weight on file to calculate BMI. Physical Exam: GEN: alert, well developed HEENT: clear conjunctiva, MMM LUNGS: unlabored respiration   Skin Testing:  Skin prick testing was placed, which includes aeroallergens/foods, histamine control, and saline control.  Verbal consent was obtained prior to placing test.  Patient tolerated procedure well.  Allergy  testing results were read and interpreted by myself, documented by clinical staff. Adequate positive and negative control.  Positive results to:  Results discussed with patient/family.  Airborne Adult Perc - 02/02/24 0913     Time Antigen Placed 0913    Allergen Manufacturer Jestine    Location Back    Number of Test 55    1. Control-Buffer 50% Glycerol Negative    2. Control-Histamine 3+    3. Bahia 3+    4. Bermuda 3+    5. Johnson 3+    6. [place]  Blue 3+    7. Meadow Fescue 3+    8. Perennial Rye 3+    9. Timothy 3+    10. Ragweed Mix 3+    11. Cocklebur 3+    12. Plantain,  English 3+    13. Baccharis Negative    14. Dog Fennel 2+    15. Russian Thistle Negative    16. Lamb's Quarters Negative    17. Sheep Sorrell Negative    18. Rough Pigweed 2+    19. Marsh Elder, Rough 3+    20. Mugwort, Common 2+    21. Box, Elder Negative    22. Cedar, red 3+    23. Sweet Gum Negative    24. Pecan Pollen 3+    25. Pine Mix Negative    26. Walnut, Black Pollen 3+    27. Red Mulberry 2+    28. Ash Mix Negative    29. Birch Mix 2+     30. Beech American Negative    31. Cottonwood, Eastern Negative    32. Hickory, White 3+    33. Maple Mix Negative    34. Oak, Eastern Mix 2+    35. Sycamore Eastern Negative    36. Alternaria Alternata Negative    37. Cladosporium Herbarum Negative    38. Aspergillus Mix Negative    39. Penicillium Mix Negative    40. Bipolaris Sorokiniana (Helminthosporium) Negative    41. Drechslera Spicifera (Curvularia) Negative    42. Mucor Plumbeus Negative    43. Fusarium Moniliforme Negative    44. Aureobasidium Pullulans (pullulara) Negative    45. Rhizopus Oryzae Negative    46. Botrytis Cinera Negative    47. Epicoccum Nigrum Negative    48. Phoma Betae Negative    49. Dust Mite Mix 3+    50. Cat Hair 10,000 BAU/ml 3+    51.  Dog Epithelia 3+    52. Mixed Feathers Negative    53. Horse Epithelia Negative    54. Cockroach, German 3+    55.  Tobacco Leaf Negative           Assessment:   1. Seasonal allergic rhinitis due to pollen   2. Allergic rhinitis due to animal hair or dander   3. Allergic rhinitis due to dust mite   4. Allergic rhinitis due to insect   5. Moderate persistent asthma without complication     Plan/Recommendations:  Moderate Persistent Asthma: - Spirometry today with poor effort, possible restriction + obstruction. Discussed starting Symbicort  BID, not PRN.   - Maintenance inhaler: start Symbicort  160-4.5mcg 1 puff twice daily.   - Rescue inhaler: Albuterol  2 puffs via spacer or 1 vial via nebulizer every 4-6 hours as needed for respiratory symptoms of cough, shortness of breath, or wheezing Asthma control goals:  Full participation in all desired activities (may need albuterol  before activity) Albuterol  use two times or less a week on average (not counting use with activity) Cough interfering with sleep two times or less a month Oral steroids no more than once a year No hospitalizations   Allergic Rhinitis: - Due to turbinate hypertrophy, seasonal  symptoms, asthma and unresponsive to over the counter meds, will perform skin testing to identify aeroallergen triggers.   - Positive skin test 01/2024: trees, grasses, weeds, dust mites, cats, dogs, cockroach  - Avoidance measures discussed. - Use nasal saline rinses before nose sprays such as with Neilmed Sinus Rinse.   - Use Azelastine  2 sprays each nostril twice daily as needed for runny nose, drainage, sneezing, congestion. Aim upward and outward. - Use Zyrtec 10 mg or Xyzal  5mg  daily.  - Use Singulair  10mg  daily. Stop if there are any mood/behavioral changes. - Consider allergy  shots as long term control of your symptoms by teaching your immune system to be more tolerant of your allergy  triggers.  Call you insurance company to get a cost estimate with the CPT codes for allergy  shots.  If this is something you are interested in, call us  back and we get the vials mixed and Epipen sent in for allergy  shots for traditional, 2 shots/2 vials.    ALLERGEN AVOIDANCE MEASURES   Dust Mites Use central air conditioning and heat; and change the filter monthly.  Pleated filters work better than mesh filters.  Electrostatic filters may also be used; wash the filter monthly.  Window air conditioners may be used, but do not clean the air as well as a central air conditioner.  Change or wash the filter monthly. Keep windows closed.  Do not use attic fans.   Encase the mattress, box springs and pillows with zippered, dust proof covers. Wash the bed linens in hot water weekly.   Remove carpet, especially from the bedroom. Remove stuffed animals, throw pillows, dust ruffles, heavy drapes and other items that collect dust from the bedroom. Do not use a humidifier.   Use wood, vinyl or leather furniture instead of cloth furniture in the bedroom. Keep the indoor humidity at 30 - 40%.    Cockroach Limit spread of food around the house; especially keep food out of bedrooms. Keep food and garbage in closed  containers with a tight lid.  Never leave food out in the kitchen.  Do not leave out pet food or dirty food bowls. Mop the kitchen floor and wash countertops at least once a week. Repair leaky pipes and faucets so there is no standing water to attract roaches. Plug up cracks in the house through which cockroaches can enter. Use bait stations and approved pesticides to reduce cockroach  infestation. Pollen Avoidance Pollen levels are highest during the mid-day and afternoon.  Consider this when planning outdoor activities. Avoid being outside when the grass is being mowed, or wear a mask if the pollen-allergic person must be the one to mow the grass. Keep the windows closed to keep pollen outside of the home. Use an air conditioner to filter the air. Take a shower, wash hair, and change clothing after working or playing outdoors during pollen season. Pet Dander Keep the pet out of your bedroom and restrict it to only a few rooms. Be advised that keeping the pet in only one room will not limit the allergens to that room. Dont pet, hug or kiss the pet; if you do, wash your hands with soap and water. High-efficiency particulate air (HEPA) cleaners run continuously in a bedroom or living room can reduce allergen levels over time. Regular use of a high-efficiency vacuum cleaner or a central vacuum can reduce allergen levels. Giving your pet a bath at least once a week can reduce airborne allergen.    Return in about 2 months (around 04/01/2024).  Arleta Blanch, MD [facility]       "

## 2024-02-02 NOTE — Patient Instructions (Addendum)
 Moderate Persistent Asthma: - Maintenance inhaler: start Symbicort  160-4.60mcg 1 puff twice daily.   - Rescue inhaler: Albuterol  2 puffs via spacer or 1 vial via nebulizer every 4-6 hours as needed for respiratory symptoms of cough, shortness of breath, or wheezing Asthma control goals:  Full participation in all desired activities (may need albuterol  before activity) Albuterol  use two times or less a week on average (not counting use with activity) Cough interfering with sleep two times or less a month Oral steroids no more than once a year No hospitalizations   Allergic Rhinitis: - Positive skin test 01/2024: trees, grasses, weeds, dust mites, cats, dogs, cockroach  - Use nasal saline rinses before nose sprays such as with Neilmed Sinus Rinse.   - Use Azelastine  2 sprays each nostril twice daily as needed for runny nose, drainage, sneezing, congestion. Aim upward and outward. - Use Zyrtec 10 mg or Xyzal  5mg  daily.  - Use Singulair  10mg  daily. Stop if there are any mood/behavioral changes. - Consider allergy  shots as long term control of your symptoms by teaching your immune system to be more tolerant of your allergy  triggers.  Call you insurance company to get a cost estimate with the CPT codes for allergy  shots.  If this is something you are interested in, call us  back and we get the vials mixed and Epipen sent in for allergy  shots.    ALLERGEN AVOIDANCE MEASURES   Dust Mites Use central air conditioning and heat; and change the filter monthly.  Pleated filters work better than mesh filters.  Electrostatic filters may also be used; wash the filter monthly.  Window air conditioners may be used, but do not clean the air as well as a central air conditioner.  Change or wash the filter monthly. Keep windows closed.  Do not use attic fans.   Encase the mattress, box springs and pillows with zippered, dust proof covers. Wash the bed linens in hot water weekly.   Remove carpet, especially from  the bedroom. Remove stuffed animals, throw pillows, dust ruffles, heavy drapes and other items that collect dust from the bedroom. Do not use a humidifier.   Use wood, vinyl or leather furniture instead of cloth furniture in the bedroom. Keep the indoor humidity at 30 - 40%.    Cockroach Limit spread of food around the house; especially keep food out of bedrooms. Keep food and garbage in closed containers with a tight lid.  Never leave food out in the kitchen.  Do not leave out pet food or dirty food bowls. Mop the kitchen floor and wash countertops at least once a week. Repair leaky pipes and faucets so there is no standing water to attract roaches. Plug up cracks in the house through which cockroaches can enter. Use bait stations and approved pesticides to reduce cockroach infestation. Pollen Avoidance Pollen levels are highest during the mid-day and afternoon.  Consider this when planning outdoor activities. Avoid being outside when the grass is being mowed, or wear a mask if the pollen-allergic person must be the one to mow the grass. Keep the windows closed to keep pollen outside of the home. Use an air conditioner to filter the air. Take a shower, wash hair, and change clothing after working or playing outdoors during pollen season. Pet Dander Keep the pet out of your bedroom and restrict it to only a few rooms. Be advised that keeping the pet in only one room will not limit the allergens to that room. Dont pet, hug or  kiss the pet; if you do, wash your hands with soap and water. High-efficiency particulate air (HEPA) cleaners run continuously in a bedroom or living room can reduce allergen levels over time. Regular use of a high-efficiency vacuum cleaner or a central vacuum can reduce allergen levels. Giving your pet a bath at least once a week can reduce airborne allergen.

## 2024-02-23 ENCOUNTER — Ambulatory Visit: Payer: Self-pay

## 2024-02-25 ENCOUNTER — Ambulatory Visit: Payer: Self-pay | Admitting: Nurse Practitioner

## 2024-02-25 ENCOUNTER — Encounter: Payer: Self-pay | Admitting: Nurse Practitioner

## 2024-02-25 ENCOUNTER — Ambulatory Visit: Admitting: Nurse Practitioner

## 2024-02-25 VITALS — BP 129/89 | HR 98 | Ht <= 58 in | Wt 185.0 lb

## 2024-02-25 DIAGNOSIS — H906 Mixed conductive and sensorineural hearing loss, bilateral: Secondary | ICD-10-CM
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
## 2024-04-16 ENCOUNTER — Ambulatory Visit: Admitting: Family Medicine
